# Patient Record
Sex: Male | Born: 1960 | Race: White | Hispanic: No | Marital: Married | Smoking: Former smoker
Health system: Southern US, Community
[De-identification: ages and names within clinical notes are randomized; demographics above are authoritative.]

## PROBLEM LIST (undated history)

## (undated) DIAGNOSIS — Z951 Presence of aortocoronary bypass graft: Secondary | ICD-10-CM

## (undated) DIAGNOSIS — Z8551 Personal history of malignant neoplasm of bladder: Secondary | ICD-10-CM

## (undated) DIAGNOSIS — Z87898 Personal history of other specified conditions: Secondary | ICD-10-CM

## (undated) DIAGNOSIS — N318 Other neuromuscular dysfunction of bladder: Secondary | ICD-10-CM

## (undated) DIAGNOSIS — Z973 Presence of spectacles and contact lenses: Secondary | ICD-10-CM

## (undated) DIAGNOSIS — R319 Hematuria, unspecified: Secondary | ICD-10-CM

## (undated) DIAGNOSIS — C679 Malignant neoplasm of bladder, unspecified: Secondary | ICD-10-CM

## (undated) DIAGNOSIS — Z87820 Personal history of traumatic brain injury: Secondary | ICD-10-CM

## (undated) DIAGNOSIS — M545 Low back pain, unspecified: Secondary | ICD-10-CM

## (undated) DIAGNOSIS — N281 Cyst of kidney, acquired: Secondary | ICD-10-CM

## (undated) DIAGNOSIS — I252 Old myocardial infarction: Secondary | ICD-10-CM

## (undated) DIAGNOSIS — E785 Hyperlipidemia, unspecified: Secondary | ICD-10-CM

## (undated) DIAGNOSIS — M5126 Other intervertebral disc displacement, lumbar region: Secondary | ICD-10-CM

## (undated) DIAGNOSIS — Z8546 Personal history of malignant neoplasm of prostate: Secondary | ICD-10-CM

## (undated) DIAGNOSIS — I2510 Atherosclerotic heart disease of native coronary artery without angina pectoris: Secondary | ICD-10-CM

## (undated) DIAGNOSIS — G8929 Other chronic pain: Secondary | ICD-10-CM

## (undated) DIAGNOSIS — K219 Gastro-esophageal reflux disease without esophagitis: Secondary | ICD-10-CM

## (undated) HISTORY — PX: INGUINAL HERNIA REPAIR: SUR1180

## (undated) HISTORY — PX: CORONARY ARTERY BYPASS GRAFT: SHX141

## (undated) HISTORY — PX: TRANSTHORACIC ECHOCARDIOGRAM: SHX275

---
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
## 2011-04-23 ENCOUNTER — Other Ambulatory Visit: Payer: Self-pay | Admitting: Urology

## 2011-05-01 ENCOUNTER — Encounter (HOSPITAL_BASED_OUTPATIENT_CLINIC_OR_DEPARTMENT_OTHER): Payer: Self-pay | Admitting: *Deleted

## 2011-05-01 NOTE — Progress Notes (Signed)
NPO AFTER MN. PT AT ARRIVE AT 0745. NEEDS HG.

## 2011-05-02 ENCOUNTER — Other Ambulatory Visit: Payer: Self-pay | Admitting: Urology

## 2011-05-02 ENCOUNTER — Encounter (HOSPITAL_BASED_OUTPATIENT_CLINIC_OR_DEPARTMENT_OTHER): Payer: Self-pay | Admitting: Anesthesiology

## 2011-05-02 ENCOUNTER — Ambulatory Visit (HOSPITAL_BASED_OUTPATIENT_CLINIC_OR_DEPARTMENT_OTHER)
Admission: RE | Admit: 2011-05-02 | Discharge: 2011-05-02 | Disposition: A | Discharge status: Home / Self Care | Payer: 59 | Source: Ambulatory Visit | Attending: Urology | Admitting: Urology

## 2011-05-02 ENCOUNTER — Encounter (HOSPITAL_BASED_OUTPATIENT_CLINIC_OR_DEPARTMENT_OTHER)
Admission: RE | Disposition: A | Discharge status: Home / Self Care | Payer: Self-pay | Source: Ambulatory Visit | Attending: Urology

## 2011-05-02 ENCOUNTER — Ambulatory Visit (HOSPITAL_COMMUNITY): Payer: 59

## 2011-05-02 ENCOUNTER — Ambulatory Visit (HOSPITAL_BASED_OUTPATIENT_CLINIC_OR_DEPARTMENT_OTHER): Payer: 59 | Admitting: Anesthesiology

## 2011-05-02 DIAGNOSIS — R310 Gross hematuria: Secondary | ICD-10-CM | POA: Insufficient documentation

## 2011-05-02 DIAGNOSIS — C61 Malignant neoplasm of prostate: Secondary | ICD-10-CM | POA: Insufficient documentation

## 2011-05-02 DIAGNOSIS — D494 Neoplasm of unspecified behavior of bladder: Secondary | ICD-10-CM | POA: Insufficient documentation

## 2011-05-02 DIAGNOSIS — K219 Gastro-esophageal reflux disease without esophagitis: Secondary | ICD-10-CM | POA: Insufficient documentation

## 2011-05-02 HISTORY — DX: Other intervertebral disc displacement, lumbar region: M51.26

## 2011-05-02 HISTORY — PX: OTHER SURGICAL HISTORY: SHX169

## 2011-05-02 HISTORY — PX: TRANSURETHRAL RESECTION OF BLADDER TUMOR: SHX2575

## 2011-05-02 LAB — POCT HEMOGLOBIN-HEMACUE: Hemoglobin: 16.9 g/dL (ref 13.0–17.0)

## 2011-05-02 SURGERY — CYSTOSCOPY
Anesthesia: General | Site: Ureter

## 2011-05-02 MED ORDER — DEXTROSE-NACL 5-0.45 % IV SOLN: INTRAVENOUS | Status: DC

## 2011-05-02 MED ORDER — LACTATED RINGERS IV SOLN
INTRAVENOUS | Status: DC | PRN
  Administered 2011-05-02 (×2): via INTRAVENOUS

## 2011-05-02 MED ORDER — FENTANYL CITRATE 0.05 MG/ML IJ SOLN: 25.0000 ug | INTRAMUSCULAR | Status: DC | PRN

## 2011-05-02 MED ORDER — DEXAMETHASONE SODIUM PHOSPHATE 4 MG/ML IJ SOLN
INTRAMUSCULAR | Status: DC | PRN
  Administered 2011-05-02: 4 mg via INTRAVENOUS

## 2011-05-02 MED ORDER — KETOROLAC TROMETHAMINE 30 MG/ML IJ SOLN
INTRAMUSCULAR | Status: DC | PRN
  Administered 2011-05-02: 30 mg via INTRAVENOUS

## 2011-05-02 MED ORDER — ONDANSETRON HCL 4 MG/2ML IJ SOLN
INTRAMUSCULAR | Status: DC | PRN
  Administered 2011-05-02: 4 mg via INTRAVENOUS

## 2011-05-02 MED ORDER — CEFAZOLIN SODIUM 1-5 GM-% IV SOLN
INTRAVENOUS | Status: DC | PRN
  Administered 2011-05-02: 2 g via INTRAVENOUS

## 2011-05-02 MED ORDER — INDIGOTINDISULFONATE SODIUM 8 MG/ML IJ SOLN
INTRAMUSCULAR | Status: DC | PRN
  Administered 2011-05-02: 5 mL via INTRAVENOUS

## 2011-05-02 MED ORDER — PROPOFOL 10 MG/ML IV EMUL
INTRAVENOUS | Status: DC | PRN
  Administered 2011-05-02: 250 mg via INTRAVENOUS

## 2011-05-02 MED ORDER — MITOMYCIN CHEMO FOR BLADDER INSTILLATION 40 MG: 40.0000 mg | Freq: Once | INTRAVENOUS | Status: DC

## 2011-05-02 MED ORDER — LIDOCAINE HCL (CARDIAC) 20 MG/ML IV SOLN
INTRAVENOUS | Status: DC | PRN
  Administered 2011-05-02: 100 mg via INTRAVENOUS

## 2011-05-02 MED ORDER — SODIUM CHLORIDE 0.9 % IR SOLN
Status: DC | PRN
  Administered 2011-05-02: 3000 mL

## 2011-05-02 MED ORDER — LACTATED RINGERS IV SOLN
INTRAVENOUS | Status: DC
  Administered 2011-05-02: 08:00:00 via INTRAVENOUS

## 2011-05-02 MED ORDER — MIDAZOLAM HCL 5 MG/5ML IJ SOLN
INTRAMUSCULAR | Status: DC | PRN
  Administered 2011-05-02: 2 mg via INTRAVENOUS

## 2011-05-02 MED ORDER — FENTANYL CITRATE 0.05 MG/ML IJ SOLN
INTRAMUSCULAR | Status: DC | PRN
  Administered 2011-05-02: 100 ug via INTRAVENOUS
  Administered 2011-05-02 (×2): 50 ug via INTRAVENOUS
  Administered 2011-05-02: 25 ug via INTRAVENOUS
  Administered 2011-05-02 (×2): 12.5 ug via INTRAVENOUS
  Administered 2011-05-02: 50 ug via INTRAVENOUS

## 2011-05-02 MED ORDER — PROMETHAZINE HCL 25 MG/ML IJ SOLN: 6.2500 mg | INTRAMUSCULAR | Status: DC | PRN

## 2011-05-02 MED ORDER — CEFAZOLIN SODIUM 1-5 GM-% IV SOLN: 1.0000 g | INTRAVENOUS | Status: DC

## 2011-05-02 SURGICAL SUPPLY — 38 items
BAG DRAIN URO-CYSTO SKYTR STRL (DRAIN) ×3 IMPLANT
BAG URINE DRAINAGE (UROLOGICAL SUPPLIES) IMPLANT
BAG URINE LEG 19OZ MD ST LTX (BAG) IMPLANT
BAG URINE LEG 500ML (DRAIN) ×3 IMPLANT
CANISTER SUCT LVC 12 LTR MEDI- (MISCELLANEOUS) ×6 IMPLANT
CATH FOLEY 2WAY SLVR  5CC 16FR (CATHETERS) ×1
CATH FOLEY 2WAY SLVR  5CC 20FR (CATHETERS)
CATH FOLEY 2WAY SLVR  5CC 22FR (CATHETERS)
CATH FOLEY 2WAY SLVR 5CC 16FR (CATHETERS) ×2 IMPLANT
CATH FOLEY 2WAY SLVR 5CC 20FR (CATHETERS) IMPLANT
CATH FOLEY 2WAY SLVR 5CC 22FR (CATHETERS) IMPLANT
CLOTH BEACON ORANGE TIMEOUT ST (SAFETY) ×3 IMPLANT
DRAPE CAMERA CLOSED 9X96 (DRAPES) ×3 IMPLANT
ELECT LOOP HF 26F 30D .35MM (CUTTING LOOP) IMPLANT
ELECT REM PT RETURN 9FT ADLT (ELECTROSURGICAL) ×3
ELECTRODE REM PT RTRN 9FT ADLT (ELECTROSURGICAL) ×2 IMPLANT
EVACUATOR MICROVAS BLADDER (UROLOGICAL SUPPLIES) IMPLANT
GLOVE BIO SURGEON STRL SZ7 (GLOVE) ×3 IMPLANT
GLOVE BIOGEL PI IND STRL 6.5 (GLOVE) ×2 IMPLANT
GLOVE BIOGEL PI INDICATOR 6.5 (GLOVE) ×1
GLOVE ECLIPSE 6.0 STRL STRAW (GLOVE) ×6 IMPLANT
GOWN BRE IMP SLV AUR LG STRL (GOWN DISPOSABLE) ×6 IMPLANT
GUIDEWIRE ANG ZIPWIRE 038X150 (WIRE) ×3 IMPLANT
GUIDEWIRE STR DUAL SENSOR (WIRE) ×3 IMPLANT
HOLDER FOLEY CATH W/STRAP (MISCELLANEOUS) IMPLANT
KIT ASPIRATION TUBING (SET/KITS/TRAYS/PACK) ×3 IMPLANT
LOOP CUTTING 24FR OLYMPUS (CUTTING LOOP) ×3 IMPLANT
NDL SAFETY ECLIPSE 18X1.5 (NEEDLE) IMPLANT
NEEDLE HYPO 18GX1.5 SHARP (NEEDLE)
NEEDLE HYPO 22GX1.5 SAFETY (NEEDLE) IMPLANT
NS IRRIG 500ML POUR BTL (IV SOLUTION) ×3 IMPLANT
PACK CYSTOSCOPY (CUSTOM PROCEDURE TRAY) ×3 IMPLANT
PLUG CATH AND CAP STER (CATHETERS) IMPLANT
PORT OPEN END 8FR 1 LUMEN (CATHETERS) ×3 IMPLANT
STENT CONTOUR 6FRX26X.035 (STENTS) ×3 IMPLANT
SYR 20CC LL (SYRINGE) IMPLANT
SYRINGE IRR TOOMEY STRL 70CC (SYRINGE) ×3 IMPLANT
WATER STERILE IRR 3000ML UROMA (IV SOLUTION) ×18 IMPLANT

## 2011-05-02 NOTE — H&P (Signed)
  Richard Brewer is an 50 y.o. male.    HPI: Mr Febus has a history of gross hematuria on and off since January.  CT scan showed normal upper tracts and a lesion in the posterior wall of the bladder.  Cystoscopy showed a 4 cm papillary tumor on the left posterior wall of the bladder.  He is scheduled for cysto TURBT.  Past Medical History  Diagnosis Date  . Bladder tumor     w/ hematuria  . Lumbar herniated disc     x2 disc-- occ. pain-- goes to chiapractor  . Reflux occasional    watches diet  . Concussion with loss of consciousness 1970's    no residual    Past Surgical History  Procedure Date  . Inguinal hernia repair INFANT    BILATERAL  . Pilonidal cyst excision 1978    Medications Prior to Admission  Medication Dose Route Frequency Provider Last Rate Last Dose  . ceFAZolin (ANCEF) IVPB 1 g/50 mL premix  1 g Intravenous 30 min Pre-Op       . lactated ringers infusion   Intravenous Continuous Einar Pheasant, MD 100 mL/hr at 05/02/11 0800     No current outpatient prescriptions on file as of 05/02/2011.    Allergies: No Known Allergies  History reviewed. No pertinent family history.  Social History:  reports that he quit smoking about 2 years ago. His smoking use included Cigarettes. He has a 18 pack-year smoking history. He has never used smokeless tobacco. He reports that he drinks about 6 ounces of alcohol per week. He reports that he does not use illicit drugs.  Review of Systems: Pertinent items are noted in HPI. A comprehensive review of systems was negative except as noted in the HPI. Results for orders placed during the hospital encounter of 05/02/11 (from the past 48 hour(s))  POCT HEMOGLOBIN-HEMACUE     Status: Normal   Collection Time   05/02/11  8:40 AM      Component Value Range Comment   Hemoglobin 16.9  13.0 - 17.0 (g/dL)     No results found.  Temp:  [97.4 F (36.3 C)] 97.4 F (36.3 C) (11/16 0824) Pulse Rate:  [65] 65  (11/16  0824) Resp:  [20] 20  (11/16 0824) BP: (130)/(89) 130/89 mmHg (11/16 0824) SpO2:  [96 %] 96 % (11/16 0824) Weight:  [95.255 kg (210 lb)] 210 lb (95.255 kg) (11/15 1013)  Physical Exam: General appearance: alert and appears stated age Head: Normocephalic, without obvious abnormality, atraumatic Eyes: conjunctivae/corneas clear. EOM's intact.  Oropharynx: moist mucous membranes Neck: supple, symmetrical, trachea midline Resp: normal respiratory effort Cardio: regular rate and rhythm Back: symmetric, no curvature. ROM normal. No CVA tenderness. GI: soft, non-tender; bowel sounds normal; no masses,  no organomegaly Male genitalia: penis: normal male phallus with no lesions or discharge.Testes: bilaterally descended with no masses or tenderness. no hernias Pelvic: deferred Extremities: extremities normal, atraumatic, no cyanosis or edema Skin: Skin color normal. No visible rashes or lesions Neurologic: Grossly normal  Assessment/Plan Bladder tumor  Plan: Cysto TURBT.  Vergene Marland-HENRY 05/02/2011, 9:24 AM

## 2011-05-02 NOTE — Op Note (Deleted)
Preoperative diagnosis: Adenocarcinoma of prostate, clinical stage TI C., Gleason 4+3  Postoperative diagnosis: Same  Principal procedure: I-125 brachytherapy, cystoscopy  Surgeon: Araf Clugston  Radiation oncologist: Kathrynn Running  Anesthesia: Gen. With LMA  Complications: None  Brief history:  This man is here today for brachytherapy to complete treatment for prostate cancer.  He first presented to our [place] office in February of this year, for second opinion about elevating trend to the PSA.Overview 2 years, his PSA increased from 4.8 in June 2010 to 11.6 in May of 2012. He had been followed by Dr. Rito Ehrlich in [place]. Because of the increasing trend, I recommended ultrasound and biopsy of the prostate. That was performed on 11/26/2010. Glandular size was measured at 48 cc. Biopsies were positive as follows:  Left base lateral, Gleason 3+4, 5% of core Left mid medial, Gleason 3+4, 40% of core Left apex lateral, Gleason 4+3, 10% of core Left apex medial, Gleason 4+3, 50% of core Right mid medial, Gleason 3+3, 10% of core Right apex medial, Gleason 3+4, 50% of core.  Bone scan performed 12/06/2010 was negative. CT of the abdomen and pelvis was performed and this showed increasing size of the prostate compared to a scan in 2 years ago. There is some asymmetry at the base of the prostate. The radiologist read this out as worrisome for progression of tumor. However, I think that this is just some lobulation of the base of the prostate.  He was seen by Dr. Kathrynn Running for consideration of radiotherapy for prostate cancer. It was elected to give him IMRT with possible seed boost. He Has completed IMRT.  Description of procedure: The patient was identified in the holding area and received preoperative IV antibiotics. He was taken to the operating room where general anesthetic was administered using the LMA. He was placed in the dorsolithotomy position genitalia and perineum were prepped and draped.  Timeout was then performed.  A rectal tube and Foley catheter were then placed. Transrectal ultrasound probe was placed by Dr. Kathrynn Running. The perineal template as well as fixation needles were then placed. Scanning of the prostate and treatment planning was performed by Dr. Kathrynn Running.  At this point, I entered the room. Time out was again performed. According to the pre-planned treatment template, a total of 95 iodine seeds were placed using 24 needles. For specifics of the treatment, please see the printed sonographic and planning printout. Following placement of all needles/seeds, the fixation needles and template as well as transrectal ultrasound probe and rectal tube were removed. Spot fluoroscopic images were then taken, and flexible cystoscopy was performed under sterile conditions. Urethral revealed no seeds or lesions. Prostate was obstructive. Latter was entered and inspected circumferentially. There were mild trabeculations. No tumors or foreign bodies were noted. No seeds were identified. At this point approximately 200 cc of saline were left in the bladder, the scope was removed, and the procedure terminated. The patient was awakened and taken to the PACU in stable condition. He tolerated the procedure well.

## 2011-05-02 NOTE — Op Note (Signed)
Richard Brewer is a 50 y.o.   05/02/2011  General  Surgeon: Wendie Simmer. Richard Brewer  PROCEDURE: Cystoscopy, TURBT, Insertion of left JJ stent  Indication: The patient is a 50 years old male with history of gross hematuria on and off since January.  He was seen in the office a month ago.  CT scan showed normal kidneys and a mass on the left lateral wall of the bladder.  Cystoscopy showed a papillary tumor on the left lateral wall of the bladder that measures about 5 cm.  He is scheduled today for cystoscopy, TURBT.  The patient was identified by his wrist band and proper time out was taken.  Under general anesthesia the patient was prepped and draped and placed in the dorsolithotomy position.  A panendoscope was inserted in the bladder.  The anterior urethra is normal; there is moderate prostate hypertrophy.  There is a 5 cm papillary tumor on the left lateral wall of the bladder.  The tumor involves the left ureteral orifice.   The cystoscope was removed.  The urethra was sequentially dilated up to a # 28 Fr [employer] sound.  A resectoscope was then inserted in the bladder.  Resection of the bladder tumor was done.  The left ureteral orifice was also resected.  Hemostasis was secured with electrocautery.  The specimen was then irrigated out of the bladder.  Resection of the base of the bladder tumor was done for staging purposes.  The resectoscope was then removed.  The cystoscope was reinserted in the bladder.  A sensor wire was passed through the cystoscope but could not be passed through the ureteral orice.  One ampule of indigo carmine was then given intravenously.  A glide wire was passed through an open ended catheter and passed through the ureteral orifice.  The open ended catheter was passed over the glide wire in the mid ureter/  The glide wire was then replaced with the sensor wire.  A # [id number] JJ catheter was passed over the sensor wire.  The sensor wire was removed.  Fluoroscopy showed the proximal end  of the JJ stent in the renal pelvis and the distal end in the bladder.  The patient tolerated the procedure well and left the OR in satisfactory condition to PACU.

## 2011-05-02 NOTE — Anesthesia Postprocedure Evaluation (Signed)
  Immediate Anesthesia Transfer of Care Note  Patient: Richard Brewer  Procedure(s) Performed:  CYSTOSCOPY - mytomicin c ; TRANSURETHRAL RESECTION OF BLADDER TUMOR (TURBT); CYSTOSCOPY/RETROGRADE/URETEROSCOPY  Patient Location: PACU  Anesthesia Type: General  Level of Consciousness: awake, sedated, patient cooperative and responds to stimulation  Airway & Oxygen Therapy: Patient Spontanous Breathing and Patient connected to face mask oxygen  Post-op Assessment: Report given to PACU RN, Post -op Vital signs reviewed and stable and Patient moving all extremities  Post vital signs: Reviewed and stable  Complications: No apparent anesthesia complications

## 2011-05-02 NOTE — Anesthesia Procedure Notes (Addendum)
Procedure Name: LMA Insertion Date/Time: 05/02/2011 9:23 AM Performed by: Iline Oven Pre-anesthesia Checklist: Patient identified, Emergency Drugs available, Suction available and Patient being monitored Patient Re-evaluated:Patient Re-evaluated prior to inductionOxygen Delivery Method: Circle System Utilized Preoxygenation: Pre-oxygenation with 100% oxygen Intubation Type: IV induction Ventilation: Mask ventilation without difficulty LMA: LMA inserted LMA Size: 4.0 Number of attempts: 1 Airway Equipment and Method: bite block Placement Confirmation: positive ETCO2 Tube secured with: Tape Dental Injury: Teeth and Oropharynx as per pre-operative assessment

## 2011-05-02 NOTE — Anesthesia Preprocedure Evaluation (Signed)
Anesthesia Evaluation  Patient identified by MRN, date of birth, ID band Patient awake    Reviewed: Allergy & Precautions, H&P , NPO status , Patient's Chart, lab work & pertinent test results, reviewed documented beta blocker date and time   Airway Mallampati: II TM Distance: >3 FB Neck ROM: Full    Dental  (+) Dental Advisory Given   Pulmonary neg pulmonary ROS,  clear to auscultation        Cardiovascular neg cardio ROS Regular Normal Denies cardiac symptoms   Neuro/Psych Negative Neurological ROS  Negative Psych ROS   GI/Hepatic negative GI ROS, Neg liver ROS,   Endo/Other  Negative Endocrine ROS  Renal/GU negative Renal ROS   Bladder Lesion    Musculoskeletal negative musculoskeletal ROS (+)   Abdominal   Peds negative pediatric ROS (+)  Hematology negative hematology ROS (+)   Anesthesia Other Findings Upper front bridge  Reproductive/Obstetrics negative OB ROS                           Anesthesia Physical Anesthesia Plan  ASA: I  Anesthesia Plan: General   Post-op Pain Management:    Induction: Intravenous  Airway Management Planned: LMA  Additional Equipment:   Intra-op Plan:   Post-operative Plan: Extubation in OR  Informed Consent: I have reviewed the patients History and Physical, chart, labs and discussed the procedure including the risks, benefits and alternatives for the proposed anesthesia with the patient or authorized representative who has indicated his/her understanding and acceptance.     Plan Discussed with: CRNA and Surgeon  Anesthesia Plan Comments:         Anesthesia Quick Evaluation

## 2011-05-02 NOTE — Progress Notes (Signed)
Instructed wife and patient on foley catheter care at home, verbalized and demonstrated care.

## 2011-05-02 NOTE — Transfer of Care (Signed)
Immediate Anesthesia Transfer of Care Note  Patient: Richard Brewer  Procedure(s) Performed:  CYSTOSCOPY - mytomicin c ; TRANSURETHRAL RESECTION OF BLADDER TUMOR (TURBT); CYSTOSCOPY/RETROGRADE/URETEROSCOPY  Patient Location: PACU  Anesthesia Type: General  Level of Consciousness: awake, sedated, patient cooperative and responds to stimulation  Airway & Oxygen Therapy: Patient Spontanous Breathing and Patient connected to face mask oxygen  Post-op Assessment: Report given to PACU RN, Post -op Vital signs reviewed and stable and Patient moving all extremities  Post vital signs: Reviewed and stable  Complications: No apparent anesthesia complications

## 2011-05-15 ENCOUNTER — Encounter (HOSPITAL_BASED_OUTPATIENT_CLINIC_OR_DEPARTMENT_OTHER): Payer: Self-pay | Admitting: Urology

## 2011-05-24 ENCOUNTER — Emergency Department (HOSPITAL_COMMUNITY): Payer: 59

## 2011-05-24 ENCOUNTER — Encounter (HOSPITAL_COMMUNITY): Payer: Self-pay | Admitting: *Deleted

## 2011-05-24 ENCOUNTER — Inpatient Hospital Stay (HOSPITAL_COMMUNITY)
Admission: EM | Admit: 2011-05-24 | Discharge: 2011-05-27 | DRG: 690 | Disposition: A | Discharge status: Home / Self Care | Payer: 59 | Attending: Urology | Admitting: Urology

## 2011-05-24 DIAGNOSIS — Z87891 Personal history of nicotine dependence: Secondary | ICD-10-CM

## 2011-05-24 DIAGNOSIS — N12 Tubulo-interstitial nephritis, not specified as acute or chronic: Principal | ICD-10-CM | POA: Diagnosis present

## 2011-05-24 DIAGNOSIS — Z906 Acquired absence of other parts of urinary tract: Secondary | ICD-10-CM

## 2011-05-24 DIAGNOSIS — R509 Fever, unspecified: Secondary | ICD-10-CM | POA: Diagnosis present

## 2011-05-24 DIAGNOSIS — C679 Malignant neoplasm of bladder, unspecified: Secondary | ICD-10-CM | POA: Diagnosis present

## 2011-05-24 DIAGNOSIS — R066 Hiccough: Secondary | ICD-10-CM | POA: Diagnosis present

## 2011-05-24 LAB — COMPREHENSIVE METABOLIC PANEL
AST: 17 U/L (ref 0–37)
BUN: 18 mg/dL (ref 6–23)
CO2: 24 mEq/L (ref 19–32)
Chloride: 96 mEq/L (ref 96–112)
Creatinine, Ser: 1.3 mg/dL (ref 0.50–1.35)
GFR calc non Af Amer: 62 mL/min — ABNORMAL LOW (ref >90)
Total Bilirubin: 0.7 mg/dL (ref 0.3–1.2)

## 2011-05-24 LAB — DIFFERENTIAL
Lymphocytes Relative: 5 % — ABNORMAL LOW (ref 12–46)
Monocytes Absolute: 0.7 10*3/uL (ref 0.1–1.0)
Monocytes Relative: 6 % (ref 3–12)
Neutro Abs: 9.8 10*3/uL — ABNORMAL HIGH (ref 1.7–7.7)

## 2011-05-24 LAB — CBC
HCT: 40.9 % (ref 39.0–52.0)
Hemoglobin: 13.9 g/dL (ref 13.0–17.0)
RBC: 4.49 MIL/uL (ref 4.22–5.81)
WBC: 11 10*3/uL — ABNORMAL HIGH (ref 4.0–10.5)

## 2011-05-24 LAB — URINE MICROSCOPIC-ADD ON

## 2011-05-24 LAB — URINALYSIS, ROUTINE W REFLEX MICROSCOPIC
Glucose, UA: NEGATIVE mg/dL
pH: 7 (ref 5.0–8.0)

## 2011-05-24 MED ORDER — ACETAMINOPHEN 325 MG PO TABS
650.0000 mg | ORAL_TABLET | ORAL | Status: DC | PRN
  Administered 2011-05-24 – 2011-05-25 (×4): 650 mg via ORAL
  Filled 2011-05-24 (×4): qty 2

## 2011-05-24 MED ORDER — HYDROMORPHONE HCL PF 1 MG/ML IJ SOLN
1.0000 mg | Freq: Once | INTRAMUSCULAR | Status: AC
  Administered 2011-05-24: 1 mg via INTRAVENOUS
  Filled 2011-05-24: qty 1

## 2011-05-24 MED ORDER — ACETAMINOPHEN 500 MG PO TABS
1000.0000 mg | ORAL_TABLET | Freq: Once | ORAL | Status: AC
  Administered 2011-05-24: 1000 mg via ORAL
  Filled 2011-05-24: qty 2

## 2011-05-24 MED ORDER — ONDANSETRON HCL 4 MG/2ML IJ SOLN
INTRAMUSCULAR | Status: AC
  Administered 2011-05-24: 4 mg
  Filled 2011-05-24: qty 2

## 2011-05-24 MED ORDER — ONDANSETRON HCL 4 MG/2ML IJ SOLN
4.0000 mg | INTRAMUSCULAR | Status: DC | PRN
  Administered 2011-05-26 – 2011-05-27 (×2): 4 mg via INTRAVENOUS
  Filled 2011-05-24 (×2): qty 2

## 2011-05-24 MED ORDER — IOHEXOL 300 MG/ML  SOLN
100.0000 mL | Freq: Once | INTRAMUSCULAR | Status: AC | PRN
  Administered 2011-05-24: 100 mL via INTRAVENOUS

## 2011-05-24 MED ORDER — PIPERACILLIN-TAZOBACTAM 3.375 G IVPB
3.3750 g | Freq: Three times a day (TID) | INTRAVENOUS | Status: DC
  Administered 2011-05-24 – 2011-05-27 (×8): 3.375 g via INTRAVENOUS
  Filled 2011-05-24 (×12): qty 50

## 2011-05-24 MED ORDER — SENNOSIDES-DOCUSATE SODIUM 8.6-50 MG PO TABS
2.0000 | ORAL_TABLET | Freq: Every day | ORAL | Status: DC
  Filled 2011-05-24 (×4): qty 2

## 2011-05-24 MED ORDER — PIPERACILLIN-TAZOBACTAM 3.375 G IVPB: 3.3750 g | Freq: Once | INTRAVENOUS | Status: DC

## 2011-05-24 MED ORDER — PIPERACILLIN-TAZOBACTAM 3.375 G IVPB 30 MIN
3.3750 g | Freq: Once | INTRAVENOUS | Status: AC
  Administered 2011-05-24: 3.375 g via INTRAVENOUS
  Filled 2011-05-24 (×2): qty 50

## 2011-05-24 MED ORDER — PROMETHAZINE HCL 25 MG/ML IJ SOLN
25.0000 mg | INTRAMUSCULAR | Status: DC | PRN
  Administered 2011-05-24 – 2011-05-27 (×4): 25 mg via INTRAVENOUS
  Filled 2011-05-24 (×4): qty 1

## 2011-05-24 MED ORDER — HYDROCODONE-ACETAMINOPHEN 5-325 MG PO TABS: 1.0000 | ORAL_TABLET | ORAL | Status: DC | PRN

## 2011-05-24 MED ORDER — KCL IN DEXTROSE-NACL 10-5-0.45 MEQ/L-%-% IV SOLN
INTRAVENOUS | Status: DC
  Administered 2011-05-24 – 2011-05-26 (×4): via INTRAVENOUS
  Filled 2011-05-24 (×8): qty 1000

## 2011-05-24 MED ORDER — HYDROMORPHONE HCL PF 1 MG/ML IJ SOLN
0.5000 mg | INTRAMUSCULAR | Status: DC | PRN
  Administered 2011-05-25 – 2011-05-27 (×16): 1 mg via INTRAVENOUS
  Filled 2011-05-24 (×17): qty 1

## 2011-05-24 NOTE — ED Notes (Signed)
History of urinary problems, back pain, generally feels bad

## 2011-05-24 NOTE — ED Notes (Signed)
Attempted to call report unable to give report per the floor they will call back

## 2011-05-24 NOTE — ED Notes (Signed)
Pt resting in bed at this time. Pt is alert and oriented. Pt's wife remains at bedside. Report to [employer]

## 2011-05-24 NOTE — ED Notes (Signed)
Pt returned from ct scan

## 2011-05-24 NOTE — ED Notes (Signed)
Pt presenting to ed with c/o post-op stent pain. Pt states he had a stent placed around thanksgiving and is having fever, pain and body aches. Pt states he does not feel well at all but he's not sure how he's suppose to feel.

## 2011-05-24 NOTE — H&P (Signed)
Richard Brewer is an 50 y.o. male.    Chief Complaint: Fevers  HPI:  50yo man with h/o T1G3 large volume bladder cancer s/p transurethral resection 05/06/11 presents with on/off malaise for 2 weeks and one day of fever, chills, dysuria.  Pt was treated for UTI 1 week ago with short course of Bactrim and had UCX 4 days ago that was negative per report.  He now presents with recurrent fevers, progressive maliase and inability to maintain hydration / nutrition.  UA today with signifiant pyuria, + nitrite and CT in ER with bilat stranding c/w pyelo.  Denies flank pain or gross hematuria. Admits to mild stent colic.  Past Medical History  Diagnosis Date  . Bladder tumor     w/ hematuria  . Lumbar herniated disc     x2 disc-- occ. pain-- goes to chiapractor  . Reflux occasional    watches diet  . Concussion with loss of consciousness 1970's    no residual    Past Surgical History  Procedure Date  . Inguinal hernia repair INFANT    BILATERAL  . Pilonidal cyst excision 1978  . Cystoscopy 05/02/2011    Procedure: CYSTOSCOPY;  Surgeon: Lindaann Slough, MD;  Location: [facility];  Service: Urology;  Laterality: N/A;  mytomicin c   . Transurethral resection of bladder tumor 05/02/2011    Procedure: TRANSURETHRAL RESECTION OF BLADDER TUMOR (TURBT);  Surgeon: Lindaann Slough, MD;  Location: [facility];  Service: Urology;  Laterality: N/A;  . Cystoscopy/retrograde/ureteroscopy 05/02/2011    Procedure: CYSTOSCOPY/RETROGRADE/URETEROSCOPY;  Surgeon: Lindaann Slough, MD;  Location: [facility];  Service: Urology;  Laterality: Left;    No family history on file. Social History:  reports that he quit smoking about 2 years ago. His smoking use included Cigarettes. He has a 18 pack-year smoking history. He has never used smokeless tobacco. He reports that he drinks alcohol. He reports that he does not use illicit drugs.  Allergies: No Known  Allergies  Medications Prior to Admission  Medication Dose Route Frequency Provider Last Rate Last Dose  . acetaminophen (TYLENOL) tablet 1,000 mg  1,000 mg Oral Once Geoffery Lyons, MD   1,000 mg at 05/24/11 1316  . HYDROmorphone (DILAUDID) injection 1 mg  1 mg Intravenous Once Geoffery Lyons, MD   1 mg at 05/24/11 1317  . iohexol (OMNIPAQUE) 300 MG/ML solution 100 mL  100 mL Intravenous Once PRN Medication Radiologist   100 mL at 05/24/11 1416  . ondansetron (ZOFRAN) 4 MG/2ML injection        4 mg at 05/24/11 1324  . piperacillin-tazobactam (ZOSYN) IVPB 3.375 g  3.375 g Intravenous Once Reece Packer, PHARMD      . DISCONTD: piperacillin-tazobactam (ZOSYN) IVPB 3.375 g  3.375 g Intravenous Once Geoffery Lyons, MD       No current outpatient prescriptions on file as of 05/24/2011.    Results for orders placed during the hospital encounter of 05/24/11 (from the past 48 hour(s))  CBC     Status: Abnormal   Collection Time   05/24/11 12:36 PM      Component Value Range Comment   WBC 11.0 (*) 4.0 - 10.5 (K/uL)    RBC 4.49  4.22 - 5.81 (MIL/uL)    Hemoglobin 13.9  13.0 - 17.0 (g/dL)    HCT 16.1  09.6 - 04.5 (%)    MCV 91.1  78.0 - 100.0 (fL)    MCH 31.0  26.0 - 34.0 (pg)  MCHC 34.0  30.0 - 36.0 (g/dL)    RDW 09.8  11.9 - 14.7 (%)    Platelets 215  150 - 400 (K/uL)   DIFFERENTIAL     Status: Abnormal   Collection Time   05/24/11 12:36 PM      Component Value Range Comment   Neutrophils Relative 89 (*) 43 - 77 (%)    Neutro Abs 9.8 (*) 1.7 - 7.7 (K/uL)    Lymphocytes Relative 5 (*) 12 - 46 (%)    Lymphs Abs 0.5 (*) 0.7 - 4.0 (K/uL)    Monocytes Relative 6  3 - 12 (%)    Monocytes Absolute 0.7  0.1 - 1.0 (K/uL)    Eosinophils Relative 0  0 - 5 (%)    Eosinophils Absolute 0.0  0.0 - 0.7 (K/uL)    Basophils Relative 0  0 - 1 (%)    Basophils Absolute 0.0  0.0 - 0.1 (K/uL)   COMPREHENSIVE METABOLIC PANEL     Status: Abnormal   Collection Time   05/24/11 12:36 PM      Component  Value Range Comment   Sodium 133 (*) 135 - 145 (mEq/L)    Potassium 3.6  3.5 - 5.1 (mEq/L)    Chloride 96  96 - 112 (mEq/L)    CO2 24  19 - 32 (mEq/L)    Glucose, Bld 129 (*) 70 - 99 (mg/dL)    BUN 18  6 - 23 (mg/dL)    Creatinine, Ser 8.29  0.50 - 1.35 (mg/dL)    Calcium 9.3  8.4 - 10.5 (mg/dL)    Total Protein 7.4  6.0 - 8.3 (g/dL)    Albumin 3.2 (*) 3.5 - 5.2 (g/dL)    AST 17  0 - 37 (U/L)    ALT 44  0 - 53 (U/L)    Alkaline Phosphatase 77  39 - 117 (U/L)    Total Bilirubin 0.7  0.3 - 1.2 (mg/dL)    GFR calc non Af Amer 62 (*) >90 (mL/min)    GFR calc Af Amer 71 (*) >90 (mL/min)   URINALYSIS, ROUTINE W REFLEX MICROSCOPIC     Status: Abnormal   Collection Time   05/24/11 12:48 PM      Component Value Range Comment   Color, Urine RED (*) YELLOW  BIOCHEMICALS MAY BE AFFECTED BY COLOR   APPearance CLOUDY (*) CLEAR     Specific Gravity, Urine 1.020  1.005 - 1.030     pH 7.0  5.0 - 8.0     Glucose, UA NEGATIVE  NEGATIVE (mg/dL)    Hgb urine dipstick LARGE (*) NEGATIVE     Bilirubin Urine NEGATIVE  NEGATIVE     Ketones, ur NEGATIVE  NEGATIVE (mg/dL)    Protein, ur 562 (*) NEGATIVE (mg/dL)    Urobilinogen, UA 1.0  0.0 - 1.0 (mg/dL)    Nitrite POSITIVE (*) NEGATIVE     Leukocytes, UA LARGE (*) NEGATIVE    URINE MICROSCOPIC-ADD ON     Status: Abnormal   Collection Time   05/24/11 12:48 PM      Component Value Range Comment   WBC, UA TOO NUMEROUS TO COUNT  <3 (WBC/hpf)    RBC / HPF TOO NUMEROUS TO COUNT  <3 (RBC/hpf)    Bacteria, UA MANY (*) RARE     Ct Abdomen Pelvis W Contrast  05/24/2011  *RADIOLOGY REPORT*  Clinical Data: Back pain.  Fever.  CT ABDOMEN AND PELVIS WITH CONTRAST  Technique:  Multidetector CT imaging of the abdomen and pelvis was performed following the standard protocol during bolus administration of intravenous contrast.  Contrast: OMNIPAQUE IOHEXOL 300 MG/ML IV SOLN  Comparison: 04/03/2011  Findings: Left double-J ureteral stent has been placed.  No  hydronephrosis.  Symmetrical secretion of both kidneys is evident. Stent extends from the left renal pelvis to the bladder.  The nephrogram of the left kidney, however is striated  and patchy. No abscess.  Hypodensities in the kidneys are stable.  Liver, gallbladder, spleen, pancreas, adrenal glands are within normal limits.  Bowel is decompressed.  Normal appendix.  The mass at the base of the bladder is no longer present and has probably been resected.  Unremarkable prostate.  L4-5 degenerative disc disease.  No destructive bone lesion.  IMPRESSION: Interval placement of a left double-J ureteral stent and resection of the mass within the left side of the bladder base.  Patchy or striated nephrographic phase of the left kidney is evident.  This pattern can be seen with acute pyelonephritis.  No abscess is present.  There is no evidence of obstruction given the symmetrical contrast excretion pattern.  Original Report Authenticated By: Donavan Burnet, M.D.    Review of Systems  Constitutional: Positive for fever, chills, weight loss, malaise/fatigue and diaphoresis.  HENT: Negative.   Eyes: Negative.   Respiratory: Negative.   Cardiovascular: Negative.   Gastrointestinal: Negative.   Genitourinary: Positive for dysuria, frequency and flank pain.  Musculoskeletal: Positive for back pain.  Skin: Negative.   Neurological: Positive for weakness.  Endo/Heme/Allergies: Negative.   Psychiatric/Behavioral: Negative.     Blood pressure 148/92, pulse 102, temperature 102.2 F (39 C), temperature source Oral, resp. rate 18, SpO2 93.00%. Physical Exam  Constitutional: He is oriented to person, place, and time. He appears well-developed and well-nourished.       Here with wife. Visibly ill.  HENT:  Head: Normocephalic and atraumatic.  Eyes: EOM are normal. Pupils are equal, round, and reactive to light.  Neck: Normal range of motion. Neck supple.  Cardiovascular: Normal rate.   Respiratory: Effort  normal and breath sounds normal.  GI: Soft. Bowel sounds are normal.  Genitourinary: Penis normal.       No CVA Tenderness or SP tenderness  Musculoskeletal: Normal range of motion.  Neurological: He is alert and oriented to person, place, and time. He has normal reflexes.  Skin: Skin is warm. He is diaphoretic.  Psychiatric: He has a normal mood and affect. His behavior is normal. Judgment and thought content normal.     Assessment/Plan 1 - Fevers - exam, history, labs, and imaging consistent with pyelonephritis. No pelvic fluid, renal abscess, prostatic abscess present.  As pt with recurrent symptoms despite outpatient management as well as poor PO intake, will admit for IV antibiotics and hydration.  2 - Bladder Cancer - s/p resection of clinically localized T1G3 disease. Now on surveillance protocol. CT today without progressive disease.   Ashleigh Luckow 05/24/2011, 3:08 PM

## 2011-05-24 NOTE — ED Provider Notes (Signed)
History     CSN: [id number] Arrival date & time: 05/24/2011 11:43 AM   First MD Initiated Contact with Patient 05/24/11 1247      Chief Complaint  Patient presents with  . Back Pain  . Fever  . Urinary Tract Infection    (Consider location/radiation/quality/duration/timing/severity/associated sxs/prior treatment) HPI Comments: Patient with history of bladder cancer diagnosed recently.  Had surgery to remove tumor.  Shortly afterward, he developed a uti and was treated with antibiotics and improved.  Now having back pain and fever again.  Believes this is another uti.  Noted slight blood in the urine.    Patient is a 50 y.o. male presenting with back pain, fever, and urinary tract infection. The history is provided by the patient.  Back Pain  This is a recurrent problem. The current episode started yesterday. The problem occurs constantly. The problem has been gradually worsening. The pain is associated with no known injury. The pain is present in the lumbar spine. Associated symptoms include a fever.  Fever Primary symptoms of the febrile illness include fever.  Urinary Tract Infection    Past Medical History  Diagnosis Date  . Bladder tumor     w/ hematuria  . Lumbar herniated disc     x2 disc-- occ. pain-- goes to chiapractor  . Reflux occasional    watches diet  . Concussion with loss of consciousness 1970's    no residual    Past Surgical History  Procedure Date  . Inguinal hernia repair INFANT    BILATERAL  . Pilonidal cyst excision 1978  . Cystoscopy 05/02/2011    Procedure: CYSTOSCOPY;  Surgeon: Lindaann Slough, MD;  Location: [facility];  Service: Urology;  Laterality: N/A;  mytomicin c   . Transurethral resection of bladder tumor 05/02/2011    Procedure: TRANSURETHRAL RESECTION OF BLADDER TUMOR (TURBT);  Surgeon: Lindaann Slough, MD;  Location: [facility];  Service: Urology;  Laterality: N/A;  .  Cystoscopy/retrograde/ureteroscopy 05/02/2011    Procedure: CYSTOSCOPY/RETROGRADE/URETEROSCOPY;  Surgeon: Lindaann Slough, MD;  Location: [facility];  Service: Urology;  Laterality: Left;    No family history on file.  History  Substance Use Topics  . Smoking status: Former Smoker -- 1.0 packs/day for 18 years    Types: Cigarettes    Quit date: 04/30/2009  . Smokeless tobacco: Never Used  . Alcohol Use: 0.0 oz/week     no ETOH since Oct      Review of Systems  Constitutional: Positive for fever.  Musculoskeletal: Positive for back pain.  All other systems reviewed and are negative.    Allergies  Review of patient's allergies indicates no known allergies.  Home Medications   Current Outpatient Rx  Name Route Sig Dispense Refill  . IBUPROFEN 200 MG PO TABS Oral Take 200 mg by mouth every 6 (six) hours as needed. pain       BP 137/85  Pulse 125  Temp(Src) 102.2 F (39 C) (Oral)  Resp 20  SpO2 97%  Physical Exam  Nursing note and vitals reviewed. Constitutional: He is oriented to person, place, and time. He appears well-developed and well-nourished. No distress.  HENT:  Head: Normocephalic and atraumatic.  Neck: Normal range of motion. Neck supple.  Cardiovascular: Normal rate.  Exam reveals friction rub. Exam reveals no gallop.   No murmur heard. Pulmonary/Chest: Effort normal and breath sounds normal. No respiratory distress.  Abdominal: Soft. Bowel sounds are normal. He exhibits no distension. There is no  tenderness.  Musculoskeletal: Normal range of motion.  Neurological: He is alert and oriented to person, place, and time.  Skin: Skin is warm and dry. He is not diaphoretic.    ED Course  Procedures (including critical care time)   Labs Reviewed  URINALYSIS, ROUTINE W REFLEX MICROSCOPIC  CBC  DIFFERENTIAL  COMPREHENSIVE METABOLIC PANEL  URINE CULTURE   No results found.   No diagnosis found.    MDM  Patient evaluated by Dr.  Loreta Ave.  We are in agreement that the patient requires admission for IV antibiotics and hydration.  Will admit to Urology.        Geoffery Lyons, MD 05/24/11 1500

## 2011-05-25 LAB — CBC
HCT: 35.1 % — ABNORMAL LOW (ref 39.0–52.0)
MCH: 30.1 pg (ref 26.0–34.0)
MCHC: 33.3 g/dL (ref 30.0–36.0)
RDW: 12.1 % (ref 11.5–15.5)

## 2011-05-25 LAB — BASIC METABOLIC PANEL
BUN: 14 mg/dL (ref 6–23)
Chloride: 98 mEq/L (ref 96–112)
Creatinine, Ser: 1.33 mg/dL (ref 0.50–1.35)
GFR calc Af Amer: 70 mL/min — ABNORMAL LOW (ref >90)
Glucose, Bld: 143 mg/dL — ABNORMAL HIGH (ref 70–99)

## 2011-05-25 MED ORDER — BACLOFEN 10 MG PO TABS
10.0000 mg | ORAL_TABLET | Freq: Four times a day (QID) | ORAL | Status: DC
  Administered 2011-05-25 – 2011-05-27 (×8): 10 mg via ORAL
  Filled 2011-05-25 (×10): qty 1

## 2011-05-25 MED ORDER — METOCLOPRAMIDE HCL 5 MG/ML IJ SOLN
10.0000 mg | Freq: Three times a day (TID) | INTRAMUSCULAR | Status: DC
  Administered 2011-05-25 – 2011-05-27 (×8): 10 mg via INTRAVENOUS
  Filled 2011-05-25 (×10): qty 2

## 2011-05-25 NOTE — Progress Notes (Signed)
CC: Hostpital Day 2 for Pyelonephritis  Subjective: Intermit ant fevers overnight. Complains of hiccups interrupting sleep. No flank pain, emesis. No dizziness.  Objective: Vital signs in last 24 hours: Temp:  [99.6 F (37.6 C)-103 F (39.4 C)] 101.6 F (38.7 C) (12/09 0510) Pulse Rate:  [87-125] 95  (12/09 0510) Resp:  [14-21] 16  (12/09 0510) BP: (116-168)/(76-96) 121/79 mmHg (12/09 0510) SpO2:  [93 %-99 %] 96 % (12/09 0510) Weight:  [88.3 kg (194 lb 10.7 oz)] 194 lb 10.7 oz (88.3 kg) (12/08 1710) Last BM Date: 05/20/11  Intake/Output from previous day: Void x3   Intake/Output this shift:    General appearance: alert, cooperative and appears stated age Head: Normocephalic, without obvious abnormality, atraumatic Eyes: conjunctivae/corneas clear. PERRL, EOM's intact. Fundi benign. Resp: clear to auscultation bilaterally Cardio: regular rate and rhythm, S1, S2 normal, no murmur, click, rub or gallop GI: soft, non-tender; bowel sounds normal; no masses,  no organomegaly Male genitalia: normal, penis: no lesions or discharge. testes: no masses or tenderness. no hernias Extremities: extremities normal, atraumatic, no cyanosis or edema and no edema, redness or tenderness in the calves or thighs Pulses: 2+ and symmetric Skin: Skin color, texture, turgor normal. No rashes or lesions Neurologic: Grossly normal  Lab Results:   Basename 05/25/11 0358 05/24/11 1236  WBC 10.1 11.0*  HGB 11.7* 13.9  HCT 35.1* 40.9  PLT 208 215   BMET  Basename 05/25/11 0358 05/24/11 1236  NA 132* 133*  K 3.8 3.6  CL 98 96  CO2 26 24  GLUCOSE 143* 129*  BUN 14 18  CREATININE 1.33 1.30  CALCIUM 8.5 9.3   PT/INR No results found for this basename: LABPROT:2,INR:2 in the last 72 hours ABG No results found for this basename: PHART:2,PCO2:2,PO2:2,HCO3:2 in the last 72 hours  Studies/Results: Ct Abdomen Pelvis W Contrast  05/24/2011  *RADIOLOGY REPORT*  Clinical Data: Back pain.  Fever.   CT ABDOMEN AND PELVIS WITH CONTRAST  Technique:  Multidetector CT imaging of the abdomen and pelvis was performed following the standard protocol during bolus administration of intravenous contrast.  Contrast: OMNIPAQUE IOHEXOL 300 MG/ML IV SOLN  Comparison: 04/03/2011  Findings: Left double-J ureteral stent has been placed.  No hydronephrosis.  Symmetrical secretion of both kidneys is evident. Stent extends from the left renal pelvis to the bladder.  The nephrogram of the left kidney, however is striated  and patchy. No abscess.  Hypodensities in the kidneys are stable.  Liver, gallbladder, spleen, pancreas, adrenal glands are within normal limits.  Bowel is decompressed.  Normal appendix.  The mass at the base of the bladder is no longer present and has probably been resected.  Unremarkable prostate.  L4-5 degenerative disc disease.  No destructive bone lesion.  IMPRESSION: Interval placement of a left double-J ureteral stent and resection of the mass within the left side of the bladder base.  Patchy or striated nephrographic phase of the left kidney is evident.  This pattern can be seen with acute pyelonephritis.  No abscess is present.  There is no evidence of obstruction given the symmetrical contrast excretion pattern.  Original Report Authenticated By: Donavan Burnet, M.D.    Anti-infectives: Anti-infectives     Start     Dose/Rate Route Frequency Ordered Stop   05/24/11 1800   piperacillin-tazobactam (ZOSYN) IVPB 3.375 g        3.375 g 12.5 mL/hr over 240 Minutes Intravenous 3 times per day 05/24/11 1731     05/24/11 1500  piperacillin-tazobactam (ZOSYN) IVPB 3.375 g  Status:  Discontinued        3.375 g 12.5 mL/hr over 240 Minutes Intravenous  Once 05/24/11 1456 05/24/11 1458   05/24/11 1500   piperacillin-tazobactam (ZOSYN) IVPB 3.375 g        3.375 g 100 mL/hr over 30 Minutes Intravenous  Once 05/24/11 1459 05/24/11 1733          Assessment/Plan: 1 - Pyelonephritis - Pt with  intermittent fevers as expected with pyelonephritis. Continue emperic ABX,IV Hydration,  cultures pending.   2 - Hiccups - no underlying reversible cause identified on imaging or exam. Will begin scheduled Reglan.   3 - Remain in house.   LOS: 1 day    [facility], Kennard Fildes 05/25/2011

## 2011-05-26 LAB — URINE CULTURE

## 2011-05-26 MED ORDER — DEXTROSE-NACL 5-0.9 % IV SOLN
INTRAVENOUS | Status: DC
  Administered 2011-05-26: 1000 mL via INTRAVENOUS
  Administered 2011-05-26: 12:00:00 via INTRAVENOUS
  Administered 2011-05-27: 1000 mL via INTRAVENOUS

## 2011-05-26 NOTE — Progress Notes (Signed)
Patient ID: Richard Brewer, male   DOB: Nov 10, 1960, 50 y.o.   MRN: [id number]  Subjective: Patient feels better this morning.  Still complains of hiccups.  No flank pain this morning.  He voids well.  Urine is grossly clear.  Objective: Vital signs in last 24 hours: Temp:  [98.6 F (37 C)-102.3 F (39.1 C)] 99.7 F (37.6 C) (12/10 0510) Pulse Rate:  [72-89] 73  (12/10 0510) Resp:  [18-20] 20  (12/10 0510) BP: (131-155)/(79-88) 131/79 mmHg (12/10 0510) SpO2:  [95 %-98 %] 98 % (12/10 0510)  Intake/Output from previous day: 12/09 0701 - 12/10 0700 In: 925 [P.O.:925] Out: 775 [Urine:775]   Physical Exam:   Lungs - Normal respiratory effort, chest expands symmetrically.  Abdomen - Soft, non-tender & non-distended.  No CVA tenderness  Lab Results:  Basename 05/25/11 0358 05/24/11 1236  HGB 11.7* 13.9  HCT 35.1* 40.9   BMET  Basename 05/25/11 0358 05/24/11 1236  NA 132* 133*  K 3.8 3.6  CL 98 96  CO2 26 24  GLUCOSE 143* 129*  BUN 14 18  CREATININE 1.33 1.30  CALCIUM 8.5 9.3       Studies/Results: Ct Abdomen Pelvis W Contrast  05/24/2011  *RADIOLOGY REPORT*  Clinical Data: Back pain.  Fever.  CT ABDOMEN AND PELVIS WITH CONTRAST  Technique:  Multidetector CT imaging of the abdomen and pelvis was performed following the standard protocol during bolus administration of intravenous contrast.  Contrast: OMNIPAQUE IOHEXOL 300 MG/ML IV SOLN  Comparison: 04/03/2011  Findings: Left double-J ureteral stent has been placed.  No hydronephrosis.  Symmetrical secretion of both kidneys is evident. Stent extends from the left renal pelvis to the bladder.  The nephrogram of the left kidney, however is striated  and patchy. No abscess.  Hypodensities in the kidneys are stable.  Liver, gallbladder, spleen, pancreas, adrenal glands are within normal limits.  Bowel is decompressed.  Normal appendix.  The mass at the base of the bladder is no longer present and has probably been resected.   Unremarkable prostate.  L4-5 degenerative disc disease.  No destructive bone lesion.  IMPRESSION: Interval placement of a left double-J ureteral stent and resection of the mass within the left side of the bladder base.  Patchy or striated nephrographic phase of the left kidney is evident.  This pattern can be seen with acute pyelonephritis.  No abscess is present.  There is no evidence of obstruction given the symmetrical contrast excretion pattern.  Original Report Authenticated By: Donavan Burnet, M.D.    Assessment/Plan:  Acute Pyelonephritis.  Continue Zosyn.   LOS: 2 days   Richard Brewer 05/26/2011, 9:43 AM

## 2011-05-26 NOTE — Progress Notes (Signed)
INITIAL ADULT NUTRITION ASSESSMENT Date: 05/26/2011   Time: 11:16 AM Reason for Assessment: Nutrition risk   ASSESSMENT: Male 50 y.o.  Dx: Fever  Hx:  Past Medical History  Diagnosis Date  . Bladder tumor     w/ hematuria  . Lumbar herniated disc     x2 disc-- occ. pain-- goes to chiapractor  . Reflux occasional    watches diet  . Concussion with loss of consciousness 1970's    no residual  . Bladder cancer    Related Meds:  Scheduled Meds:   . baclofen  10 mg Oral QID  . metoCLOPramide (REGLAN) injection  10 mg Intravenous Q8H  . piperacillin-tazobactam (ZOSYN)  IV  3.375 g Intravenous Q8H  . senna-docusate  2 tablet Oral QHS   Continuous Infusions:   . dextrose 5 % and 0.9% NaCl    . DISCONTD: dextrose 5 % and 0.45 % NaCl with KCl 10 mEq/L 125 mL/hr at 05/26/11 0343   PRN Meds:.acetaminophen, HYDROcodone-acetaminophen, HYDROmorphone (DILAUDID) injection, ondansetron, promethazine  Ht: 5\' 10"  (177.8 cm)  Wt: 194 lb 10.7 oz (88.3 kg)  Ideal Wt: 75.5kg % Ideal Wt: 117  Usual Wt: 95.5kg % Usual Wt: 92  Body mass index is 27.93 kg/(m^2).  Food/Nutrition Related Hx: Pt and wife report poor intake since transurethral resection on 11/20 with 15 pound unintentional weight loss during this time. Pt c/o vomiting over the weekend, currently no vomiting today or yesterday. Pt had hiccups this morning, currently resolved. Pt c/o changes in taste and that even bottled water tastes bad. Pt burping and c/o gas pain during visit.   CT of abdomen/pelvis on 12/8 showed: Interval placement of a left double-J ureteral stent and resection  of the mass within the left side of the bladder base.  Patchy or striated nephrographic phase of the left kidney is  evident. This pattern can be seen with acute pyelonephritis. No  abscess is present. There is no evidence of obstruction given the  symmetrical contrast excretion pattern.  Labs:  CMP     Component Value Date/Time   NA 132*  05/25/2011 0358   K 3.8 05/25/2011 0358   CL 98 05/25/2011 0358   CO2 26 05/25/2011 0358   GLUCOSE 143* 05/25/2011 0358   BUN 14 05/25/2011 0358   CREATININE 1.33 05/25/2011 0358   CALCIUM 8.5 05/25/2011 0358   PROT 7.4 05/24/2011 1236   ALBUMIN 3.2* 05/24/2011 1236   AST 17 05/24/2011 1236   ALT 44 05/24/2011 1236   ALKPHOS 77 05/24/2011 1236   BILITOT 0.7 05/24/2011 1236   GFRNONAA 60* 05/25/2011 0358   GFRAA 70* 05/25/2011 0358    Intake/Output Summary (Last 24 hours) at 05/26/11 1121 Last data filed at 05/26/11 1000  Gross per 24 hour  Intake    800 ml  Output   1525 ml  Net   -725 ml    Diet Order: General  IVF:    dextrose 5 % and 0.9% NaCl   DISCONTD: dextrose 5 % and 0.45 % NaCl with KCl 10 mEq/L Last Rate: 125 mL/hr at 05/26/11 0343    Estimated Nutritional Needs:   Kcal:2150-2500 Protein:90-105g Fluid:2.1-2.5L  NUTRITION DIAGNOSIS: -Inadequate oral intake (NI-2.1).  Status: Ongoing -Pt meets criteria for severe PCM for acute illness AEB 7.6% weight loss and <50% intake for the past 2 weeks per pt report   RELATED TO: taste changes/nausea/vomiting/fever/hiccups  AS EVIDENCE BY: H&P, pt statement, unintentional weight loss, <50% meal intake    MONITORING/EVALUATION(Goals):  Pt to consume >75% of meals.   EDUCATION NEEDS: -Education needs addressed. Discussed nutrition therapy for nausea/vomting.   INTERVENTION: Provided pt with Biotene rinse to help with taste changes. Assisted pt with ordering snack. Encouraged bland food intake. Pt not interested in nutritional supplements. Will monitor.   Dietitian # [phone]  DOCUMENTATION CODES Per approved criteria  -Severe malnutrition in the context of acute illness or injury    Marshall Cork 05/26/2011, 11:16 AM

## 2011-05-27 MED ORDER — PROMETHAZINE HCL 25 MG PO TABS: 25.0000 mg | ORAL_TABLET | ORAL | Status: AC | PRN

## 2011-05-27 MED ORDER — BIOTENE DRY MOUTH MT LIQD
15.0000 mL | Freq: Two times a day (BID) | OROMUCOSAL | Status: DC
  Administered 2011-05-27: 15 mL via OROMUCOSAL

## 2011-05-27 MED ORDER — ACETAMINOPHEN 325 MG PO TABS: 650.0000 mg | ORAL_TABLET | ORAL | Status: AC | PRN

## 2011-05-27 MED ORDER — LEVOFLOXACIN 500 MG PO TABS: 500.0000 mg | ORAL_TABLET | ORAL | Status: AC

## 2011-05-27 MED ORDER — LEVOFLOXACIN 500 MG PO TABS
500.0000 mg | ORAL_TABLET | ORAL | Status: DC
  Administered 2011-05-27: 500 mg via ORAL
  Filled 2011-05-27 (×2): qty 1

## 2011-05-27 MED ORDER — PROMETHAZINE HCL 25 MG PO TABS: 25.0000 mg | ORAL_TABLET | ORAL | Status: DC | PRN

## 2011-05-27 MED ORDER — BACLOFEN 10 MG PO TABS: 10.0000 mg | ORAL_TABLET | Freq: Four times a day (QID) | ORAL | Status: DC

## 2011-05-27 MED ORDER — HYDROCODONE-ACETAMINOPHEN 5-325 MG PO TABS: 1.0000 | ORAL_TABLET | ORAL | Status: AC | PRN

## 2011-05-27 NOTE — Progress Notes (Signed)
D/C instructions renderd, patient verbalized understanding,care notes re: Norco given, stable,d/c home with wife.

## 2011-05-27 NOTE — Plan of Care (Signed)
Problem: Phase I Progression Outcomes Goal: Initial discharge plan identified Outcome: Completed/Met Date Met:  05/27/11 Plan is to return home at discharge

## 2011-05-27 NOTE — Plan of Care (Signed)
Problem: Consults Goal: Nutrition Consult-if indicated Outcome: Completed/Met Date Met:  05/27/11 Dietician saw 05/26/11

## 2011-05-27 NOTE — Progress Notes (Signed)
D/C home, stable,Iv  Line removed,site unremarkable

## 2011-05-27 NOTE — Progress Notes (Signed)
T:  98.5  BP: 130/71   P: 63   R:  18 Still complaining of hiccups.  No nausea or vomiting today.  No flank pain. Had BM's yesterday. Voids well.  Urine clear Abdomen: soft, non distended, non tender.  Plan: Discharge home today.          Follow-up as outpatient.  Will remove JJ stent Friday.          Discharge meds: Levaquin 500 mgm daily. Phenergan 25 mgm every 4-6 H PRN.

## 2011-08-01 ENCOUNTER — Other Ambulatory Visit: Payer: Self-pay | Admitting: Urology

## 2011-08-05 ENCOUNTER — Encounter (HOSPITAL_BASED_OUTPATIENT_CLINIC_OR_DEPARTMENT_OTHER): Payer: Self-pay | Admitting: *Deleted

## 2011-08-05 NOTE — Progress Notes (Signed)
NPO AFTER MN. ARRIVES AT 0945. NEEDS HG AND EKG. 

## 2011-08-07 NOTE — H&P (Signed)
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  He received six weekly instillations of intravesical BCG.  He is scheduled for cystoscopy, TUR Bladder biopsy to rule out residual or recurrent tumor.     Past Medical History  Diagnosis Date  . Bladder tumor     w/ hematuria  . Lumbar herniated disc     x2 disc-- occ. pain-- goes to chiapractor  . Reflux occasional    watches diet  . Concussion with loss of consciousness 1970's    no residual  . Bladder cancer S/P TURBT, MITOMYCIN INSTILLATION AND LAST BCG TX 02-   Past Surgical History  Procedure Date  . Inguinal hernia repair INFANT    BILATERAL  . Pilonidal cyst excision 1978  . Cystoscopy 05/02/2011    Procedure: CYSTOSCOPY;  Surgeon: Lindaann Slough, MD;  Location: [facility];  Service: Urology;  Laterality: N/A;  mytomicin c   . Transurethral resection of bladder tumor 05/02/2011    Procedure: TRANSURETHRAL RESECTION OF BLADDER TUMOR (TURBT);  Surgeon: Lindaann Slough, MD;  Location: [facility];  Service: Urology;  Laterality: N/A;  . Cystoscopy/retrograde/ureteroscopy 05/02/2011    Procedure: CYSTOSCOPY/RETROGRADE/URETEROSCOPY;  Surgeon: Lindaann Slough, MD;  Location: [facility];  Service: Urology;  Laterality: Left;    Medications:Diazepam, Robaxin, Tramadol. Allergies: No Known Allergies  History reviewed. No pertinent family history. Social History:  reports that he quit smoking about 2 years ago. His smoking use included Cigarettes. He has a 18 pack-year smoking history. He has never used smokeless tobacco. He reports that he drinks alcohol. He reports that he does not use illicit drugs.  ROS: All systems are reviewed and negative except as noted.  Physical Exam:  Vital signs in last 24 hours:    Cardiovascular: Skin warm; not flushed Respiratory: Breaths quiet; no shortness of breath Abdomen:  No masses Neurological: Normal sensation to touch Musculoskeletal: Normal motor function arms and legs Lymphatics: No inguinal adenopathy Skin: No rashes Genitourinary:Penis and scrotal contents are within normal limits.  Impression/Assessment:  R/O recurrent bladder tumor.  Plan:  Cysto TUR Bladder biopsy  Amelianna Meller-HENRY 08/07/2011, 6:58 PM

## 2011-08-08 ENCOUNTER — Ambulatory Visit (HOSPITAL_BASED_OUTPATIENT_CLINIC_OR_DEPARTMENT_OTHER)
Admission: RE | Admit: 2011-08-08 | Discharge: 2011-08-08 | Disposition: A | Discharge status: Home / Self Care | Payer: 59 | Source: Ambulatory Visit | Attending: Urology | Admitting: Urology

## 2011-08-08 ENCOUNTER — Ambulatory Visit (HOSPITAL_BASED_OUTPATIENT_CLINIC_OR_DEPARTMENT_OTHER): Payer: 59 | Admitting: Anesthesiology

## 2011-08-08 ENCOUNTER — Other Ambulatory Visit: Payer: Self-pay | Admitting: Urology

## 2011-08-08 ENCOUNTER — Encounter (HOSPITAL_BASED_OUTPATIENT_CLINIC_OR_DEPARTMENT_OTHER): Payer: Self-pay | Admitting: *Deleted

## 2011-08-08 ENCOUNTER — Other Ambulatory Visit: Payer: Self-pay

## 2011-08-08 ENCOUNTER — Encounter (HOSPITAL_BASED_OUTPATIENT_CLINIC_OR_DEPARTMENT_OTHER): Payer: Self-pay | Admitting: Anesthesiology

## 2011-08-08 ENCOUNTER — Encounter (HOSPITAL_BASED_OUTPATIENT_CLINIC_OR_DEPARTMENT_OTHER)
Admission: RE | Disposition: A | Discharge status: Home / Self Care | Payer: Self-pay | Source: Ambulatory Visit | Attending: Urology

## 2011-08-08 DIAGNOSIS — Z8551 Personal history of malignant neoplasm of bladder: Secondary | ICD-10-CM | POA: Insufficient documentation

## 2011-08-08 DIAGNOSIS — Z79899 Other long term (current) drug therapy: Secondary | ICD-10-CM | POA: Insufficient documentation

## 2011-08-08 DIAGNOSIS — K219 Gastro-esophageal reflux disease without esophagitis: Secondary | ICD-10-CM | POA: Insufficient documentation

## 2011-08-08 DIAGNOSIS — D494 Neoplasm of unspecified behavior of bladder: Secondary | ICD-10-CM | POA: Insufficient documentation

## 2011-08-08 HISTORY — PX: TRANSURETHRAL RESECTION OF BLADDER TUMOR: SHX2575

## 2011-08-08 LAB — POCT HEMOGLOBIN-HEMACUE: Hemoglobin: 16 g/dL (ref 13.0–17.0)

## 2011-08-08 SURGERY — CYSTOSCOPY
Anesthesia: General | Site: Bladder | Wound class: Clean Contaminated

## 2011-08-08 MED ORDER — DEXAMETHASONE SODIUM PHOSPHATE 4 MG/ML IJ SOLN
INTRAMUSCULAR | Status: DC | PRN
  Administered 2011-08-08: 10 mg via INTRAVENOUS

## 2011-08-08 MED ORDER — PROMETHAZINE HCL 25 MG/ML IJ SOLN: 6.2500 mg | INTRAMUSCULAR | Status: DC | PRN

## 2011-08-08 MED ORDER — CEFAZOLIN SODIUM 1-5 GM-% IV SOLN: 1.0000 g | INTRAVENOUS | Status: DC

## 2011-08-08 MED ORDER — KETOROLAC TROMETHAMINE 30 MG/ML IJ SOLN: 15.0000 mg | Freq: Once | INTRAMUSCULAR | Status: DC | PRN

## 2011-08-08 MED ORDER — GLYCINE 1.5 % IR SOLN
Status: DC | PRN
  Administered 2011-08-08: 3000 mL

## 2011-08-08 MED ORDER — ONDANSETRON HCL 4 MG/2ML IJ SOLN
INTRAMUSCULAR | Status: DC | PRN
  Administered 2011-08-08: 4 mg via INTRAVENOUS

## 2011-08-08 MED ORDER — FENTANYL CITRATE 0.05 MG/ML IJ SOLN
INTRAMUSCULAR | Status: DC | PRN
  Administered 2011-08-08: 50 ug via INTRAVENOUS
  Administered 2011-08-08 (×2): 25 ug via INTRAVENOUS
  Administered 2011-08-08 (×2): 50 ug via INTRAVENOUS
  Administered 2011-08-08 (×2): 25 ug via INTRAVENOUS
  Administered 2011-08-08: 50 ug via INTRAVENOUS

## 2011-08-08 MED ORDER — LIDOCAINE HCL (CARDIAC) 20 MG/ML IV SOLN
INTRAVENOUS | Status: DC | PRN
  Administered 2011-08-08: 80 mg via INTRAVENOUS

## 2011-08-08 MED ORDER — PHENAZOPYRIDINE HCL 200 MG PO TABS
200.0000 mg | ORAL_TABLET | Freq: Three times a day (TID) | ORAL | Status: DC
  Administered 2011-08-08: 200 mg via ORAL

## 2011-08-08 MED ORDER — LACTATED RINGERS IV SOLN
INTRAVENOUS | Status: DC
  Administered 2011-08-08 (×3): via INTRAVENOUS

## 2011-08-08 MED ORDER — SODIUM CHLORIDE 0.9 % IR SOLN
Status: DC | PRN
  Administered 2011-08-08: 3000 mL

## 2011-08-08 MED ORDER — FENTANYL CITRATE 0.05 MG/ML IJ SOLN: 25.0000 ug | INTRAMUSCULAR | Status: DC | PRN

## 2011-08-08 MED ORDER — PROPOFOL 10 MG/ML IV EMUL
INTRAVENOUS | Status: DC | PRN
  Administered 2011-08-08: 240 mg via INTRAVENOUS
  Administered 2011-08-08 (×2): 50 mg via INTRAVENOUS

## 2011-08-08 MED ORDER — CEFAZOLIN SODIUM 1-5 GM-% IV SOLN
1.0000 g | INTRAVENOUS | Status: AC
  Administered 2011-08-08: 2 g via INTRAVENOUS

## 2011-08-08 SURGICAL SUPPLY — 32 items
BAG DRAIN URO-CYSTO SKYTR STRL (DRAIN) IMPLANT
BAG URINE DRAINAGE (UROLOGICAL SUPPLIES) IMPLANT
BAG URINE LEG 19OZ MD ST LTX (BAG) IMPLANT
CANISTER SUCT LVC 12 LTR MEDI- (MISCELLANEOUS) ×2 IMPLANT
CATH FOLEY 2WAY SLVR  5CC 20FR (CATHETERS)
CATH FOLEY 2WAY SLVR  5CC 22FR (CATHETERS)
CATH FOLEY 2WAY SLVR 5CC 20FR (CATHETERS) IMPLANT
CATH FOLEY 2WAY SLVR 5CC 22FR (CATHETERS) IMPLANT
CLOTH BEACON ORANGE TIMEOUT ST (SAFETY) ×2 IMPLANT
DRAPE CAMERA CLOSED 9X96 (DRAPES) ×2 IMPLANT
ELECT LOOP HF 26F 30D .35MM (CUTTING LOOP) IMPLANT
ELECT LOOP MED HF 24F 12D (CUTTING LOOP) IMPLANT
ELECT REM PT RETURN 9FT ADLT (ELECTROSURGICAL) ×2
ELECTRODE REM PT RTRN 9FT ADLT (ELECTROSURGICAL) ×1 IMPLANT
EVACUATOR MICROVAS BLADDER (UROLOGICAL SUPPLIES) ×2 IMPLANT
GLOVE BIO SURGEON STRL SZ7 (GLOVE) ×2 IMPLANT
GLOVE ECLIPSE 7.0 STRL STRAW (GLOVE) ×4 IMPLANT
GLYCINE 1.5% IRRIG UROMATIC (IV SOLUTION) ×4 IMPLANT
HOLDER FOLEY CATH W/STRAP (MISCELLANEOUS) IMPLANT
IV NS IRRIG 3000ML ARTHROMATIC (IV SOLUTION) ×2 IMPLANT
KIT ASPIRATION TUBING (SET/KITS/TRAYS/PACK) IMPLANT
LOOP CUTTING 24FR OLYMPUS (CUTTING LOOP) IMPLANT
NDL SAFETY ECLIPSE 18X1.5 (NEEDLE) IMPLANT
NEEDLE HYPO 18GX1.5 SHARP (NEEDLE)
NEEDLE HYPO 22GX1.5 SAFETY (NEEDLE) IMPLANT
NS IRRIG 500ML POUR BTL (IV SOLUTION) IMPLANT
PACK CYSTOSCOPY (CUSTOM PROCEDURE TRAY) ×2 IMPLANT
PLUG CATH AND CAP STER (CATHETERS) IMPLANT
SET ASPIRATION TUBING (TUBING) IMPLANT
SYR 20CC LL (SYRINGE) IMPLANT
SYRINGE IRR TOOMEY STRL 70CC (SYRINGE) IMPLANT
WATER STERILE IRR 3000ML UROMA (IV SOLUTION) ×2 IMPLANT

## 2011-08-08 NOTE — Transfer of Care (Signed)
Immediate Anesthesia Transfer of Care Note  Patient: Richard Brewer  Procedure(s) Performed: Procedure(s) (LRB): CYSTOSCOPY (N/A) TRANSURETHRAL RESECTION OF BLADDER TUMOR (TURBT) (N/A)  Patient Location: Patient transported to PACU with oxygen via face mask at 4 Liters / Min  Anesthesia Type: General  Level of Consciousness: awake and alert   Airway & Oxygen Therapy: Patient Spontanous Breathing and Patient connected to face mask oxygen  Post-op Assessment: Report given to PACU RN and Post -op Vital signs reviewed and stable  Post vital signs: Reviewed and stable  Dentition: Teeth and oropharynx remain in pre-op condition  Complications: No apparent anesthesia complications

## 2011-08-08 NOTE — Op Note (Signed)
Romelo Sciandra is a 51 y.o.   08/08/2011  Pre Op Diagnosis: Rule out recurrent bladder tumor.  Post Op Diagnosis: No recurrent bladder tumor.  Procedure: Cystoscopy, TUR bladder biopsy, random bladder biopsy, bladder washings for cytology.  Surgeon: Wendie Simmer. Myracle Febres  Anesthesia: Gen.  Indication: Patient is a 50 years old male who had a TUR bladder tumor 3 months ago for high-grade TCC of the bladder. The tumor involved the left posterior wall of the bladder and the ureteral orifice was  also involved. A double-J stent was left in the left renal unit. The stent was removed about 3 weeks later. He had intravesical BCG. He is scheduled today for TUR of the bladder to rule out recurrent bladder tumor.  Procedure: Patient was identified by his wrist band and proper timeout was taken.  Under general anesthesia he was prepped and draped and placed in the dorsolithotomy position. A panendoscope was inserted in the bladder. The anterior urethra is normal; there is moderate prostatic hypertrophy. The bladder mucosa is normal, there is no stone or tumor in the bladder. There was no evidence of gross tumor at the site of the previous bladder tumor. The left ureteral orifice is normal and patent. The ureteral orifices are in normal position and shape.   The cystoscope was then removed. The urethra was dilated up to #28 [place] with Sissy Hoff sounds. Then a # 28 resectoscope was inserted in the bladder. Resection of the site of the previous bladder tumor was done. The left ureteral orifice was not resected this time. Hemostasis was secured with electrocautery. The resectoscope was then removed. The cystoscope was reinserted in the bladder and a random bladder biopsy was done with the cold cup biopsy forceps. Fulguration of the sites of bladder biopsy was then done with the Bugbee electrode. There was no evidence of bleeding at the end of the procedure. The bladder was then irrigated with normal saline and bladder  washings were sent for cytology.  The patient tolerated the procedure well and left the OR in satisfactory condition to postanesthesia care unit.  EBL:

## 2011-08-08 NOTE — Anesthesia Preprocedure Evaluation (Signed)
Anesthesia Evaluation  Patient identified by MRN, date of birth, ID band Patient awake    Reviewed: Allergy & Precautions, H&P , NPO status , Patient's Chart, lab work & pertinent test results  Airway Mallampati: II TM Distance: >3 FB Neck ROM: Full    Dental No notable dental hx.    Pulmonary neg pulmonary ROS,  clear to auscultation  Pulmonary exam normal       Cardiovascular neg cardio ROS Regular Normal    Neuro/Psych Negative Neurological ROS  Negative Psych ROS   GI/Hepatic negative GI ROS, Neg liver ROS,   Endo/Other  Negative Endocrine ROS  Renal/GU negative Renal ROS  Genitourinary negative   Musculoskeletal negative musculoskeletal ROS (+)   Abdominal   Peds negative pediatric ROS (+)  Hematology negative hematology ROS (+)   Anesthesia Other Findings   Reproductive/Obstetrics negative OB ROS                           Anesthesia Physical Anesthesia Plan  ASA: II  Anesthesia Plan: General   Post-op Pain Management:    Induction: Intravenous  Airway Management Planned: LMA  Additional Equipment:   Intra-op Plan:   Post-operative Plan:   Informed Consent:   Dental advisory given  Plan Discussed with: CRNA  Anesthesia Plan Comments:         Anesthesia Quick Evaluation

## 2011-08-08 NOTE — Anesthesia Postprocedure Evaluation (Signed)
  Anesthesia Post-op Note  Patient: Richard Brewer  Procedure(s) Performed: Procedure(s) (LRB): CYSTOSCOPY (N/A) TRANSURETHRAL RESECTION OF BLADDER TUMOR (TURBT) (N/A)  Patient Location: PACU  Anesthesia Type: General  Level of Consciousness: awake and alert   Airway and Oxygen Therapy: Patient Spontanous Breathing  Post-op Pain: mild  Post-op Assessment: Post-op Vital signs reviewed, Patient's Cardiovascular Status Stable, Respiratory Function Stable, Patent Airway and No signs of Nausea or vomiting  Post-op Vital Signs: stable  Complications: No apparent anesthesia complications

## 2011-08-08 NOTE — Anesthesia Procedure Notes (Signed)
Procedure Name: LMA Insertion Date/Time: 08/08/2011 11:00 AM Performed by: Lorrin Jackson Pre-anesthesia Checklist: Patient identified, Emergency Drugs available, Suction available and Patient being monitored Patient Re-evaluated:Patient Re-evaluated prior to inductionOxygen Delivery Method: Circle System Utilized Preoxygenation: Pre-oxygenation with 100% oxygen Intubation Type: IV induction Ventilation: Mask ventilation without difficulty LMA: LMA with gastric port inserted LMA Size: 4.0 Number of attempts: 1 Placement Confirmation: positive ETCO2 Tube secured with: Tape Dental Injury: Teeth and Oropharynx as per pre-operative assessment

## 2011-08-12 ENCOUNTER — Encounter (HOSPITAL_BASED_OUTPATIENT_CLINIC_OR_DEPARTMENT_OTHER): Payer: Self-pay | Admitting: Urology

## 2011-08-15 ENCOUNTER — Encounter (HOSPITAL_BASED_OUTPATIENT_CLINIC_OR_DEPARTMENT_OTHER): Payer: Self-pay

## 2011-11-26 ENCOUNTER — Encounter (HOSPITAL_BASED_OUTPATIENT_CLINIC_OR_DEPARTMENT_OTHER): Payer: Self-pay | Admitting: *Deleted

## 2011-11-26 ENCOUNTER — Other Ambulatory Visit: Payer: Self-pay | Admitting: Urology

## 2011-11-26 NOTE — Progress Notes (Signed)
NPO AFTER MN. ARRIVES AT 0730. NEEDS HG. CURRENT EKG IN EPIC.

## 2011-11-27 NOTE — H&P (Signed)
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  He had intravesical BCG.  Repeta cysto with biopsy in February showed no recurrence.  He has been voiding well.  He denies gross hematuria.  Cystoscopy today shows a 3 cm tumor on the left lateral wall of the bladder above the ureteral orifice.   Past Medical History Problems  1. History of  Esophageal Reflux 530.81  Surgical History Problems  1. History of  Cystoscopy With Biopsy 2. History of  Cystoscopy With Fulguration Large Lesion (Over 5cm) 3. History of  Cystoscopy With Insertion Of Ureteral Stent Left 4. History of  Hernia Repair  Current Meds 1. Robaxin 500 MG Oral Tablet; Therapy: (Recorded:16Oct2012) to 2. TraMADol HCl 50 MG Oral Tablet; Therapy: (Recorded:16Oct2012) to  Allergies Medication  1. No Known Drug Allergies  Family History Problems  1. Paternal history of  Chronic Obstructive Pulmonary Disease 2. Family history of  Death In The Family Father 3. Fraternal history of  Diabetes Mellitus V18.0 4. Family history of  Family Health Status - Mother's Age age 43 5. Family history of  Family Health Status Number Of Children 1 daughter 53. Fraternal history of  Hypertension V17.49  Social History Problems  1. Alcohol Use 2 per day 2. Caffeine Use 1-2 per day 3. Former Smoker V15.82 1 PPD smoked for 54yrs quit 23yrs ago 4. Marital History - Currently Married  Review of Systems Genitourinary, constitutional, skin, eye, otolaryngeal, hematologic/lymphatic, cardiovascular, pulmonary, endocrine, musculoskeletal, gastrointestinal, neurological and psychiatric system(s) were reviewed and pertinent findings if present are noted.    Vitals Vital Signs [Data Includes: Last 1 Day]  12Jun2013 10:53AM  Blood Pressure: 149 / 95 Temperature: 97.2 F Heart Rate: 78  Physical Exam Constitutional: Well nourished and well developed . No acute  distress.  ENT:. The ears and nose are normal in appearance.  Neck: The appearance of the neck is normal and no neck mass is present.  Pulmonary: No respiratory distress and normal respiratory rhythm and effort.  Cardiovascular: Heart rate and rhythm are normal . No peripheral edema.  Abdomen: The abdomen is soft and nontender. No masses are palpated. No CVA tenderness. No hernias are palpable. No hepatosplenomegaly noted.  Rectal: Rectal exam demonstrates no tenderness and no masses. The prostate is tender. The left seminal vesicle is nonpalpable. The right seminal vesicle is nonpalpable. The perineum is normal on inspection.  Genitourinary: Examination of the penis demonstrates no discharge, no masses, no lesions and a normal meatus. The scrotum is without lesions. The right epididymis is palpably normal and non-tender. The left epididymis is palpably normal and non-tender. The right testis is non-tender and without masses. The left testis is non-tender and without masses.  Lymphatics: The femoral and inguinal nodes are not enlarged or tender.  Skin: Normal skin turgor, no visible rash and no visible skin lesions.  Neuro/Psych:. Mood and affect are appropriate.    Results/Data Urine [Data Includes: Last 1 Day]   12Jun2013  COLOR YELLOW   APPEARANCE CLEAR   SPECIFIC GRAVITY 1.010   pH 6.0   GLUCOSE NEG mg/dL  BILIRUBIN NEG   KETONE NEG mg/dL  BLOOD NEG   PROTEIN NEG mg/dL  UROBILINOGEN 0.2 mg/dL  NITRITE NEG   LEUKOCYTE ESTERASE NEG    Procedure  Procedure: Cystoscopy   Indication: History of Urothelial Carcinoma.  Informed Consent: Risks, benefits, and potential adverse events were discussed and informed consent was obtained  from the patient . Specific risks including, but not limited to bleeding, infection, pain, allergic reaction etc. were explained.  Prep: The patient was prepped with betadine.  Anesthesia:. Local anesthesia was administered intraurethrally with 2% lidocaine  jelly.  Antibiotic prophylaxis: Ciprofloxacin.  Procedure Note:  Urethral meatus:. No abnormalities.  Anterior urethra: No abnormalities.  Prostatic urethra: No abnormalities.  Bladder: Visulization was clear. A solitary tumor was visualized in the bladder. A papillary tumor was seen in the bladder measuring approximately 3 cm in size. This tumor was located on the left side, on the posterior aspect, at the base of the bladder. The patient tolerated the procedure well.    Assessment Assessed  1. Bladder Cancer 188.9  Plan Health Maintenance (V70.0)  1. UA With REFLEX  Done: 12Jun2013 10:22AM   Cysto TURBT.   Signatures Electronically signed by : Su Grand, M.D.; Nov 26 2011 11:38AM

## 2011-11-28 ENCOUNTER — Ambulatory Visit (HOSPITAL_BASED_OUTPATIENT_CLINIC_OR_DEPARTMENT_OTHER)
Admission: RE | Admit: 2011-11-28 | Discharge: 2011-11-28 | Disposition: A | Discharge status: Home / Self Care | Payer: 59 | Source: Ambulatory Visit | Attending: Urology | Admitting: Urology

## 2011-11-28 ENCOUNTER — Encounter (HOSPITAL_BASED_OUTPATIENT_CLINIC_OR_DEPARTMENT_OTHER)
Admission: RE | Disposition: A | Discharge status: Home / Self Care | Payer: Self-pay | Source: Ambulatory Visit | Attending: Urology

## 2011-11-28 ENCOUNTER — Encounter (HOSPITAL_BASED_OUTPATIENT_CLINIC_OR_DEPARTMENT_OTHER): Payer: Self-pay | Admitting: Anesthesiology

## 2011-11-28 ENCOUNTER — Encounter (HOSPITAL_BASED_OUTPATIENT_CLINIC_OR_DEPARTMENT_OTHER): Payer: Self-pay | Admitting: *Deleted

## 2011-11-28 ENCOUNTER — Ambulatory Visit (HOSPITAL_BASED_OUTPATIENT_CLINIC_OR_DEPARTMENT_OTHER): Payer: 59 | Admitting: Anesthesiology

## 2011-11-28 DIAGNOSIS — Z79899 Other long term (current) drug therapy: Secondary | ICD-10-CM | POA: Insufficient documentation

## 2011-11-28 DIAGNOSIS — C679 Malignant neoplasm of bladder, unspecified: Secondary | ICD-10-CM | POA: Insufficient documentation

## 2011-11-28 DIAGNOSIS — K219 Gastro-esophageal reflux disease without esophagitis: Secondary | ICD-10-CM | POA: Insufficient documentation

## 2011-11-28 HISTORY — PX: TRANSURETHRAL RESECTION OF BLADDER TUMOR: SHX2575

## 2011-11-28 HISTORY — DX: Personal history of traumatic brain injury: Z87.820

## 2011-11-28 HISTORY — PX: CYSTOSCOPY: SHX5120

## 2011-11-28 SURGERY — TURBT (TRANSURETHRAL RESECTION OF BLADDER TUMOR)
Anesthesia: General | Site: Bladder | Wound class: Clean Contaminated

## 2011-11-28 MED ORDER — ONDANSETRON HCL 4 MG/2ML IJ SOLN
INTRAMUSCULAR | Status: DC | PRN
  Administered 2011-11-28: 4 mg via INTRAVENOUS

## 2011-11-28 MED ORDER — DEXAMETHASONE SODIUM PHOSPHATE 4 MG/ML IJ SOLN
INTRAMUSCULAR | Status: DC | PRN
  Administered 2011-11-28: 10 mg via INTRAVENOUS

## 2011-11-28 MED ORDER — CEFAZOLIN SODIUM 1-5 GM-% IV SOLN: 1.0000 g | INTRAVENOUS | Status: DC

## 2011-11-28 MED ORDER — LIDOCAINE HCL (CARDIAC) 20 MG/ML IV SOLN
INTRAVENOUS | Status: DC | PRN
  Administered 2011-11-28: 70 mg via INTRAVENOUS

## 2011-11-28 MED ORDER — KETOROLAC TROMETHAMINE 30 MG/ML IJ SOLN
INTRAMUSCULAR | Status: DC | PRN
  Administered 2011-11-28: 30 mg via INTRAVENOUS

## 2011-11-28 MED ORDER — LIDOCAINE HCL 2 % EX GEL
CUTANEOUS | Status: DC | PRN
  Administered 2011-11-28: 1 via URETHRAL

## 2011-11-28 MED ORDER — MIDAZOLAM HCL 5 MG/5ML IJ SOLN
INTRAMUSCULAR | Status: DC | PRN
  Administered 2011-11-28: 2 mg via INTRAVENOUS

## 2011-11-28 MED ORDER — LACTATED RINGERS IV SOLN
INTRAVENOUS | Status: DC
  Administered 2011-11-28 (×2): via INTRAVENOUS

## 2011-11-28 MED ORDER — FENTANYL CITRATE 0.05 MG/ML IJ SOLN: 25.0000 ug | INTRAMUSCULAR | Status: DC | PRN

## 2011-11-28 MED ORDER — FENTANYL CITRATE 0.05 MG/ML IJ SOLN
INTRAMUSCULAR | Status: DC | PRN
  Administered 2011-11-28 (×2): 25 ug via INTRAVENOUS
  Administered 2011-11-28: 100 ug via INTRAVENOUS
  Administered 2011-11-28 (×2): 25 ug via INTRAVENOUS

## 2011-11-28 MED ORDER — CEFAZOLIN SODIUM-DEXTROSE 2-3 GM-% IV SOLR
2.0000 g | INTRAVENOUS | Status: AC
  Administered 2011-11-28: 2 g via INTRAVENOUS

## 2011-11-28 MED ORDER — SODIUM CHLORIDE 0.9 % IR SOLN
Status: DC | PRN
  Administered 2011-11-28: 6000 mL via INTRAVESICAL

## 2011-11-28 MED ORDER — PROPOFOL 10 MG/ML IV EMUL
INTRAVENOUS | Status: DC | PRN
  Administered 2011-11-28: 220 mg via INTRAVENOUS

## 2011-11-28 SURGICAL SUPPLY — 36 items
BAG DRAIN URO-CYSTO SKYTR STRL (DRAIN) ×2 IMPLANT
BAG URINE DRAINAGE (UROLOGICAL SUPPLIES) IMPLANT
BAG URINE LEG 19OZ MD ST LTX (BAG) IMPLANT
CANISTER SUCT LVC 12 LTR MEDI- (MISCELLANEOUS) ×4 IMPLANT
CATH FOLEY 2WAY SLVR  5CC 16FR (CATHETERS) ×1
CATH FOLEY 2WAY SLVR  5CC 20FR (CATHETERS)
CATH FOLEY 2WAY SLVR  5CC 22FR (CATHETERS)
CATH FOLEY 2WAY SLVR 5CC 16FR (CATHETERS) ×1 IMPLANT
CATH FOLEY 2WAY SLVR 5CC 20FR (CATHETERS) IMPLANT
CATH FOLEY 2WAY SLVR 5CC 22FR (CATHETERS) IMPLANT
CLOTH BEACON ORANGE TIMEOUT ST (SAFETY) ×2 IMPLANT
DRAPE CAMERA CLOSED 9X96 (DRAPES) ×2 IMPLANT
ELECT LOOP HF 26F 30D .35MM (CUTTING LOOP) IMPLANT
ELECT LOOP MED HF 24F 12D CBL (CLIP) ×2 IMPLANT
ELECT REM PT RETURN 9FT ADLT (ELECTROSURGICAL)
ELECTRODE REM PT RTRN 9FT ADLT (ELECTROSURGICAL) IMPLANT
EVACUATOR MICROVAS BLADDER (UROLOGICAL SUPPLIES) IMPLANT
GLOVE BIO SURGEON STRL SZ7 (GLOVE) ×2 IMPLANT
GLOVE BIOGEL PI IND STRL 7.0 (GLOVE) ×1 IMPLANT
GLOVE BIOGEL PI INDICATOR 7.0 (GLOVE) ×1
GLOVE ECLIPSE 6.5 STRL STRAW (GLOVE) ×2 IMPLANT
GLOVE INDICATOR 6.5 STRL GRN (GLOVE) ×4 IMPLANT
HOLDER FOLEY CATH W/STRAP (MISCELLANEOUS) IMPLANT
IV NS IRRIG 3000ML ARTHROMATIC (IV SOLUTION) ×6 IMPLANT
KIT ASPIRATION TUBING (SET/KITS/TRAYS/PACK) ×2 IMPLANT
LOOP CUTTING 24FR OLYMPUS (CUTTING LOOP) IMPLANT
NDL SAFETY ECLIPSE 18X1.5 (NEEDLE) IMPLANT
NEEDLE HYPO 18GX1.5 SHARP (NEEDLE)
NEEDLE HYPO 22GX1.5 SAFETY (NEEDLE) IMPLANT
NS IRRIG 500ML POUR BTL (IV SOLUTION) ×2 IMPLANT
PACK CYSTOSCOPY (CUSTOM PROCEDURE TRAY) ×2 IMPLANT
PLUG CATH AND CAP STER (CATHETERS) IMPLANT
SET ASPIRATION TUBING (TUBING) ×2 IMPLANT
SYR 20CC LL (SYRINGE) IMPLANT
SYRINGE IRR TOOMEY STRL 70CC (SYRINGE) ×2 IMPLANT
WATER STERILE IRR 3000ML UROMA (IV SOLUTION) IMPLANT

## 2011-11-28 NOTE — Anesthesia Procedure Notes (Signed)
Procedure Name: LMA Insertion Date/Time: 11/28/2011 8:54 AM Performed by: Norva Pavlov Pre-anesthesia Checklist: Patient identified, Emergency Drugs available, Suction available and Patient being monitored Patient Re-evaluated:Patient Re-evaluated prior to inductionOxygen Delivery Method: Circle System Utilized Preoxygenation: Pre-oxygenation with 100% oxygen Intubation Type: IV induction Ventilation: Mask ventilation without difficulty LMA: LMA inserted LMA Size: 4.0 Number of attempts: 1 Airway Equipment and Method: bite block Placement Confirmation: positive ETCO2 Tube secured with: Tape Dental Injury: Teeth and Oropharynx as per pre-operative assessment

## 2011-11-28 NOTE — Discharge Instructions (Signed)
°  Post Anesthesia Home Care Instructions ° °Activity: °Get plenty of rest for the remainder of the day. A responsible adult should stay with you for 24 hours following the procedure.  °For the next 24 hours, DO NOT: °-Drive a car °-Operate machinery °-Drink alcoholic beverages °-Take any medication unless instructed by your physician °-Make any legal decisions or sign important papers. ° °Meals: °Start with liquid foods such as gelatin or soup. Progress to regular foods as tolerated. Avoid greasy, spicy, heavy foods. If nausea and/or vomiting occur, drink only clear liquids until the nausea and/or vomiting subsides. Call your physician if vomiting continues. ° °Special Instructions/Symptoms: °Your throat may feel dry or sore from the anesthesia or the breathing tube placed in your throat during surgery. If this causes discomfort, gargle with warm salt water. The discomfort should disappear within 24 hours. °CYSTOSCOPY HOME CARE INSTRUCTIONS ° °Activity: °Rest for the remainder of the day.  Do not drive or operate equipment today.  You may resume normal activities in one to two days as instructed by your physician.  ° °Meals: °Drink plenty of liquids and eat light foods such as gelatin or soup this evening.  You may return to a normal meal plan tomorrow. ° °Return to Work: °You may return to work in one to two days or as instructed by your physician. ° °Special Instructions / Symptoms: °Call your physician if any of these symptoms occur: ° ° -persistent or heavy bleeding ° -bleeding which continues after first few urination ° -large blood clots that are difficult to pass ° -urine stream diminishes or stops completely ° -fever equal to or higher than 101 degrees Farenheit. ° -cloudy urine with a strong, foul odor ° -severe pain ° °Females should always wipe from front to back after elimination.  You may feel some burning pain when you urinate.  This should disappear with time.  Applying moist heat to the lower abdomen  or a hot tub bath may help relieve the pain. \ ° °Follow-Up / Date of Return Visit to Your Physician:  *** °Call for an appointment to arrange follow-up. ° °Patient Signature:  ________________________________________________________ ° °Nurse's Signature:  ________________________________________________________ ° °

## 2011-11-28 NOTE — Op Note (Signed)
Richard Brewer is a 51 y.o.   11/28/2011  Pre-op diagnosis: Recurrent bladder tumor  Postop diagnosis: Same  Procedure done: Cystoscopy, TUR bladder tumor, random bladder biopsy.  Surgeon: Wendie Simmer. Arsh Feutz  Anesthesia: General   Indication: The patient is a 51 years old male who had a TUR bladder tumor in November 2012 for high-grade TCC of the bladder. He was treated with intravesical BCG. Cystoscopy and bladder biopsy in February 2013 showed no recurrent bladder tumor. Cystoscopy on 6/11 showed a small papillary tumor on the left side of the bladder above the ureteral orifice. He is scheduled for cystoscopy and TURBT.   Procedure: The patient was identified by his wrist band and proper timeout was taken.  Under general anesthesia he was prepped and draped and placed in the dorsolithotomy position. A panendoscope was inserted in the bladder. The anterior urethra is normal; he has moderate prostatic hypertrophy. There is a 2 cm papillary tumor on the left lateral wall of the bladder above the ureteral orifice. The ureteral orifices are in normal position and shape. There is no evidence of other tumor in the bladder. The cystoscope was removed. The urethra was then sequentially dilated up to #30 [place] with Von Buren sounds. Then a #28 gyrus resectoscope was inserted in the bladder. Resection of the papillary tumor on the left side of the bladder was done. The base of the bladder tumor was also resected for staging purposes. Specimen were sent in separate containers for definitive diagnosis. The base of the bladder tumor was then fulgurated as well as a 2 cm resection margins. The resectoscope was removed. The cystoscope was reinserted in the bladder. Random bladder biopsy of the bladder was then done. The cystoscope was removed. The resectoscope was then reinserted in the bladder and the areas of biopsy were cauterized with the resectoscope loop. There was no evidence of bleeding at the end of the  procedure. The resectoscope was then removed. Xylocaine jelly was then instilled in the urethra and a #16 [place] Foley catheter was placed in the bladder.  EBL: Minimal  The patient tolerated the procedure well and left the OR in satisfactory condition to postanesthesia care unit.

## 2011-11-28 NOTE — Anesthesia Postprocedure Evaluation (Signed)
  Anesthesia Post-op Note  Patient: Richard Brewer  Procedure(s) Performed: Procedure(s) (LRB): TRANSURETHRAL RESECTION OF BLADDER TUMOR (TURBT) (N/A) CYSTOSCOPY (N/A)  Patient Location: PACU  Anesthesia Type: General  Level of Consciousness: oriented and sedated  Airway and Oxygen Therapy: Patient Spontanous Breathing  Post-op Pain: mild  Post-op Assessment: Post-op Vital signs reviewed, Patient's Cardiovascular Status Stable, Respiratory Function Stable and Patent Airway  Post-op Vital Signs: stable  Complications: No apparent anesthesia complications

## 2011-11-28 NOTE — Transfer of Care (Signed)
Immediate Anesthesia Transfer of Care Note  Patient: Richard Brewer  Procedure(s) Performed: Procedure(s) (LRB): TRANSURETHRAL RESECTION OF BLADDER TUMOR (TURBT) (N/A) CYSTOSCOPY (N/A)  Patient Location: PACU  Anesthesia Type: General  Level of Consciousness: awake, alert  and oriented  Airway & Oxygen Therapy: Patient Spontanous Breathing and Patient connected to face mask oxygen  Post-op Assessment: Report given to PACU RN and Post -op Vital signs reviewed and stable  Post vital signs: Reviewed and stable  Complications: No apparent anesthesia complications

## 2011-11-28 NOTE — Anesthesia Preprocedure Evaluation (Signed)
Anesthesia Evaluation  Patient identified by MRN, date of birth, ID band Patient awake    Reviewed: Allergy & Precautions, H&P , NPO status , Patient's Chart, lab work & pertinent test results, reviewed documented beta blocker date and time   Airway Mallampati: II TM Distance: >3 FB Neck ROM: Full    Dental  (+) Dental Advisory Given   Pulmonary former smoker breath sounds clear to auscultation        Cardiovascular Rhythm:Regular Rate:Normal  Borderline BP, no Rx Denies cardiac symptoms   Neuro/Psych negative neurological ROS  negative psych ROS   GI/Hepatic negative GI ROS, Neg liver ROS,   Endo/Other  negative endocrine ROS  Renal/GU negative Renal ROS   Bladder lesion    Musculoskeletal negative musculoskeletal ROS (+)   Abdominal   Peds negative pediatric ROS (+)  Hematology negative hematology ROS (+)   Anesthesia Other Findings Upper front bridge  Reproductive/Obstetrics negative OB ROS                           Anesthesia Physical Anesthesia Plan  ASA: II  Anesthesia Plan: General   Post-op Pain Management:    Induction: Intravenous  Airway Management Planned: LMA  Additional Equipment:   Intra-op Plan:   Post-operative Plan: Extubation in OR  Informed Consent: I have reviewed the patients History and Physical, chart, labs and discussed the procedure including the risks, benefits and alternatives for the proposed anesthesia with the patient or authorized representative who has indicated his/her understanding and acceptance.   Dental advisory given  Plan Discussed with: CRNA and Surgeon  Anesthesia Plan Comments:         Anesthesia Quick Evaluation

## 2011-12-01 ENCOUNTER — Encounter (HOSPITAL_BASED_OUTPATIENT_CLINIC_OR_DEPARTMENT_OTHER): Payer: Self-pay | Admitting: Urology

## 2012-06-28 ENCOUNTER — Other Ambulatory Visit (HOSPITAL_COMMUNITY): Payer: Self-pay | Admitting: Urology

## 2012-06-28 DIAGNOSIS — N281 Cyst of kidney, acquired: Secondary | ICD-10-CM

## 2012-06-30 ENCOUNTER — Other Ambulatory Visit (HOSPITAL_COMMUNITY): Payer: Self-pay | Admitting: Urology

## 2012-06-30 ENCOUNTER — Ambulatory Visit (HOSPITAL_COMMUNITY): Payer: 59

## 2012-06-30 ENCOUNTER — Ambulatory Visit (HOSPITAL_COMMUNITY)
Admission: RE | Admit: 2012-06-30 | Discharge: 2012-06-30 | Disposition: A | Discharge status: Home / Self Care | Payer: 59 | Source: Ambulatory Visit | Attending: Urology | Admitting: Urology

## 2012-06-30 DIAGNOSIS — N281 Cyst of kidney, acquired: Secondary | ICD-10-CM

## 2012-07-02 ENCOUNTER — Other Ambulatory Visit (HOSPITAL_COMMUNITY): Payer: 59

## 2012-07-05 ENCOUNTER — Ambulatory Visit (HOSPITAL_COMMUNITY)
Admission: RE | Admit: 2012-07-05 | Discharge: 2012-07-05 | Disposition: A | Discharge status: Home / Self Care | Payer: 59 | Source: Ambulatory Visit | Attending: Urology | Admitting: Urology

## 2012-07-05 DIAGNOSIS — D1803 Hemangioma of intra-abdominal structures: Secondary | ICD-10-CM | POA: Insufficient documentation

## 2012-07-05 DIAGNOSIS — N281 Cyst of kidney, acquired: Secondary | ICD-10-CM | POA: Insufficient documentation

## 2012-07-05 LAB — CREATININE, SERUM: GFR calc non Af Amer: 82 mL/min — ABNORMAL LOW (ref >90)

## 2012-07-05 MED ORDER — GADOBENATE DIMEGLUMINE 529 MG/ML IV SOLN
20.0000 mL | Freq: Once | INTRAVENOUS | Status: AC | PRN
  Administered 2012-07-05: 19 mL via INTRAVENOUS

## 2013-04-19 DIAGNOSIS — I252 Old myocardial infarction: Secondary | ICD-10-CM

## 2013-04-19 HISTORY — DX: Old myocardial infarction: I25.2

## 2013-04-20 HISTORY — PX: CARDIAC CATHETERIZATION: SHX172

## 2013-10-27 HISTORY — PX: ACHILLES TENDON REPAIR: SUR1153

## 2014-02-16 IMAGING — US US RENAL
1 series · 14 of 25 positions shown · non-contrast
Comparison: None

CLINICAL DATA: Evaluate renal cyst

RENAL/URINARY TRACT ULTRASOUND COMPLETE

[Series 1: us renal · 0.32mm/px · 14 of 29 slices shown]
[im 1/29]
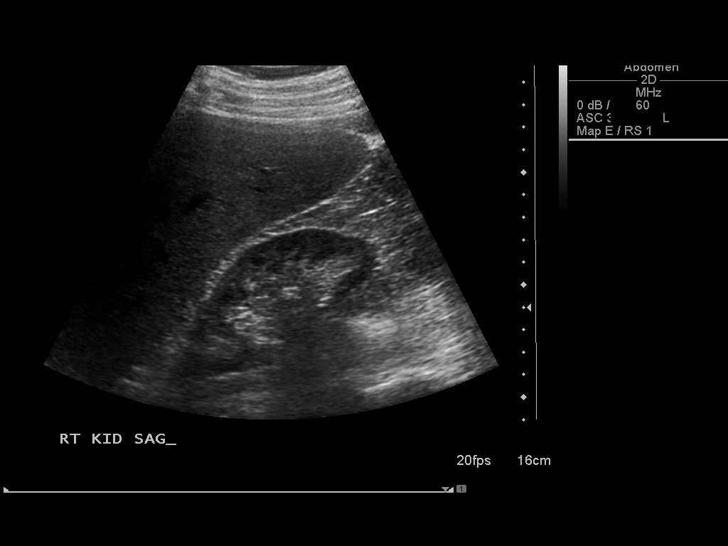
[im 3/29]
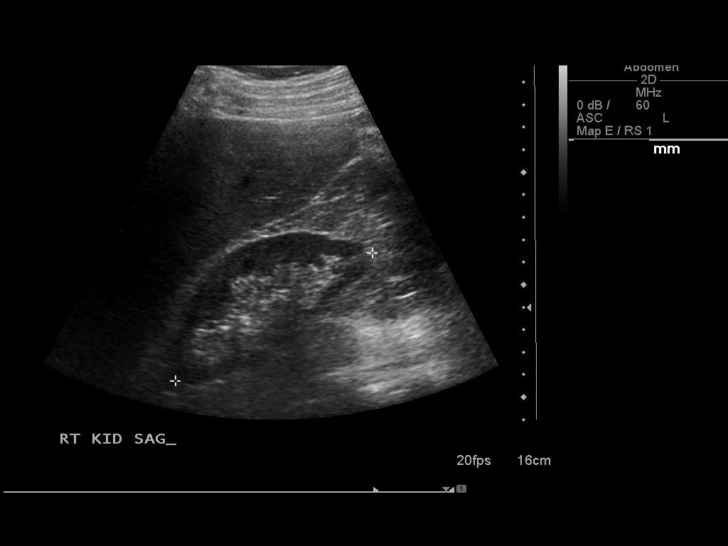
[im 5/29]
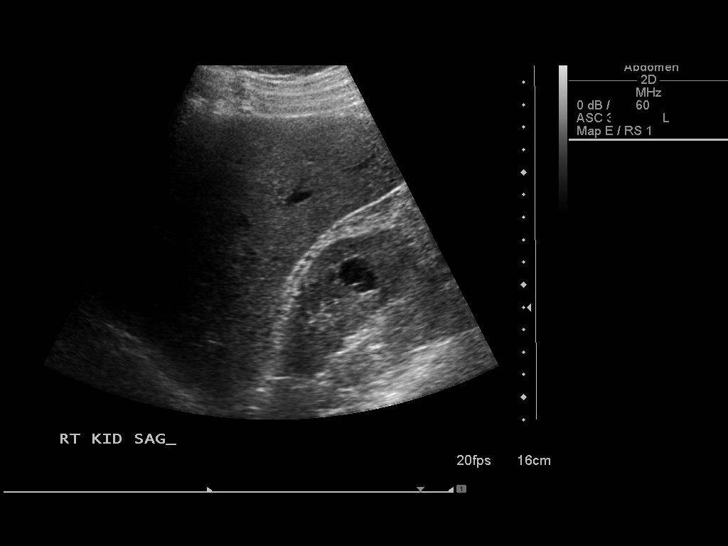
[im 8/29]
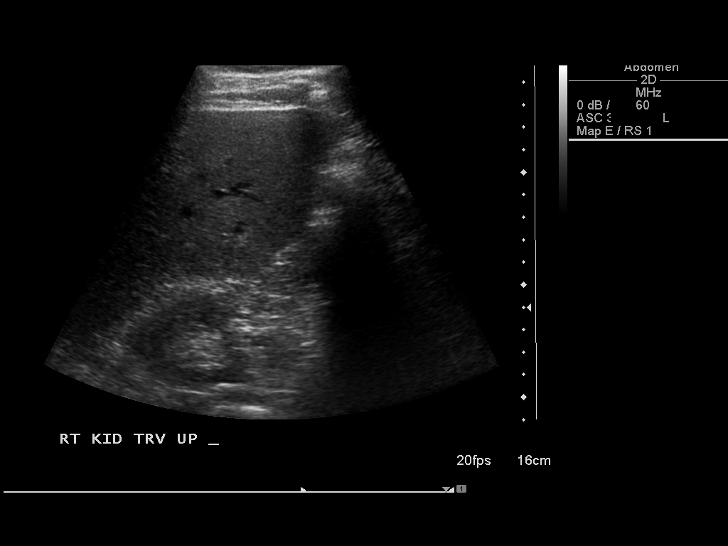
[im 10/29]
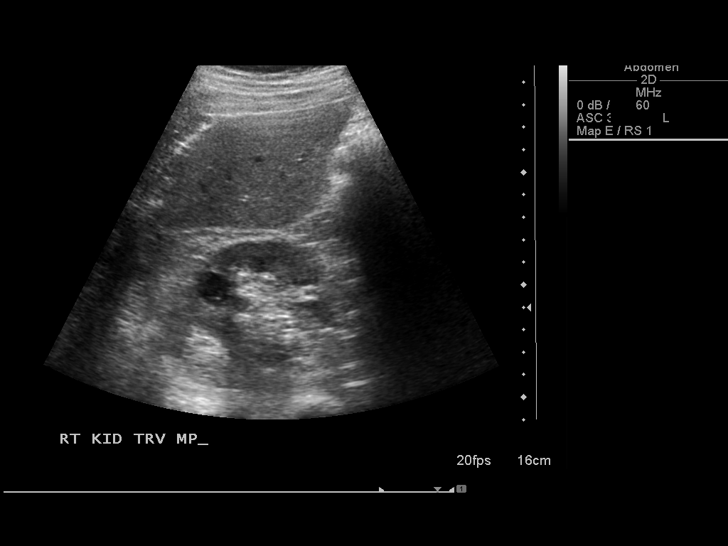
[im 11/29]
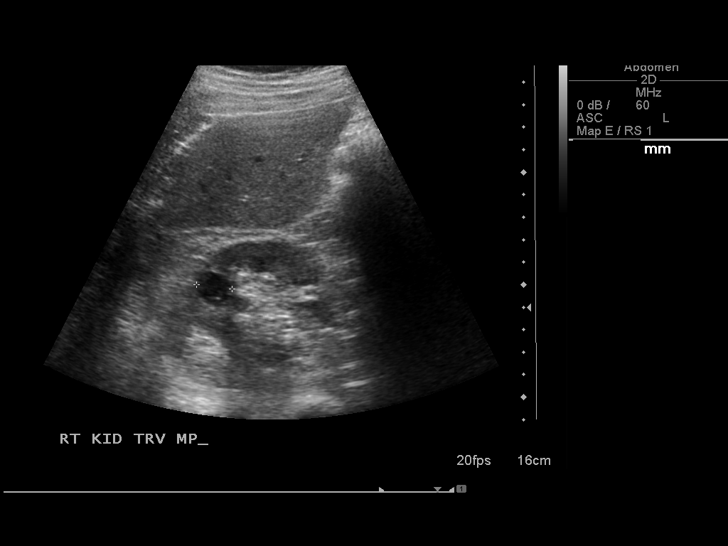
[im 13/29]
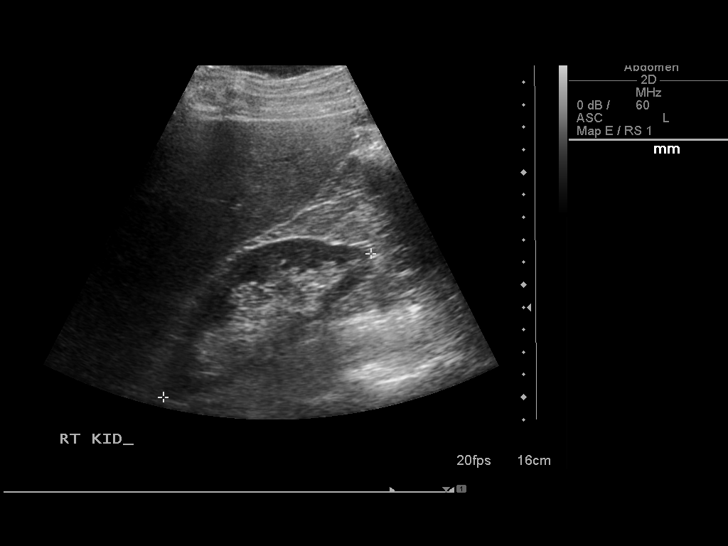
[im 16/29]
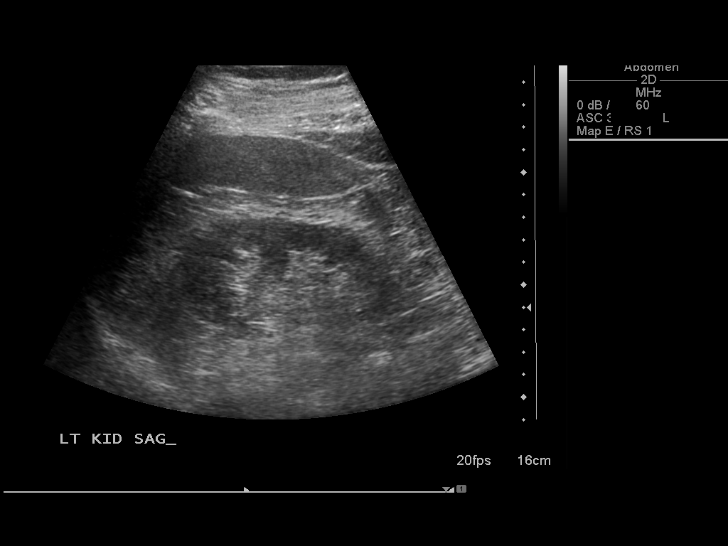
[im 18/29]
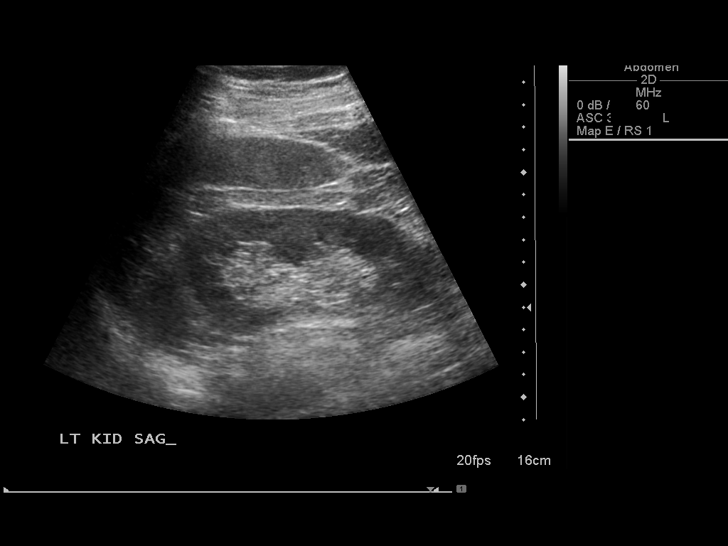
[im 19/29]
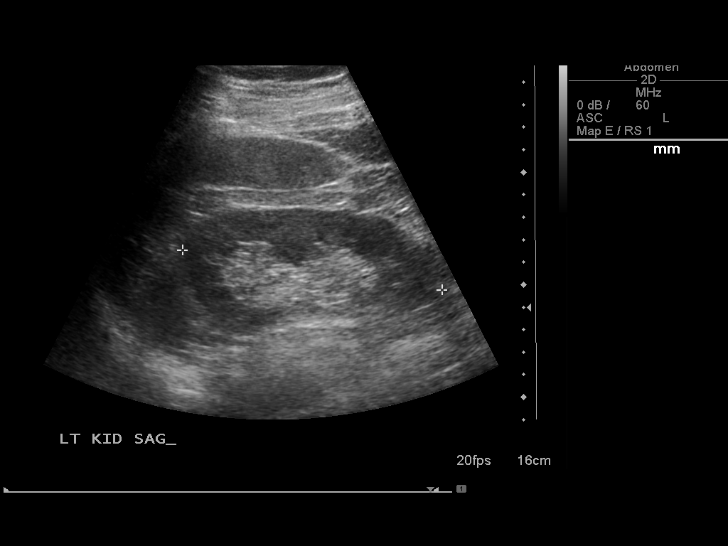
[im 22/29]
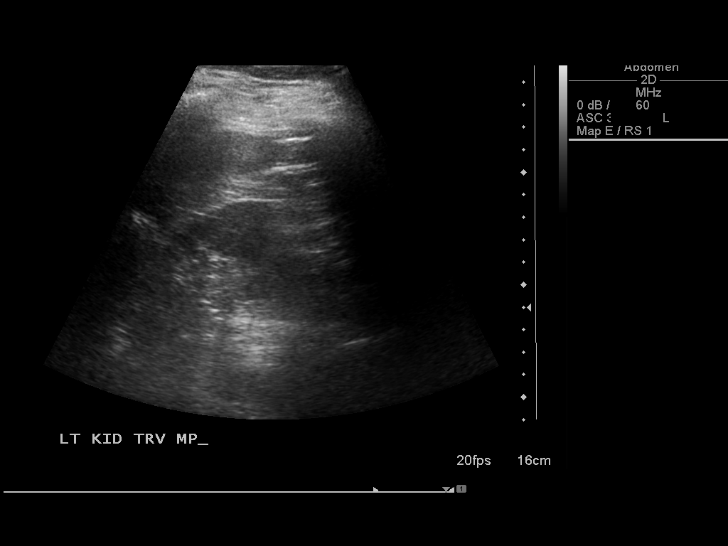
[im 24/29]
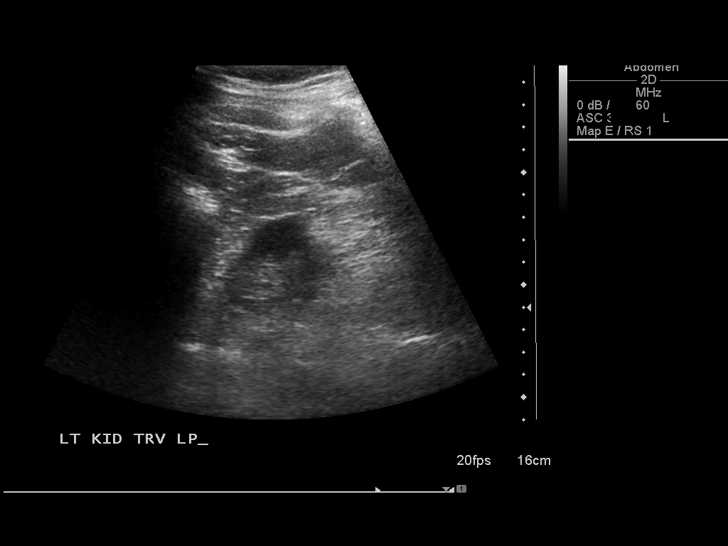
[im 26/29]
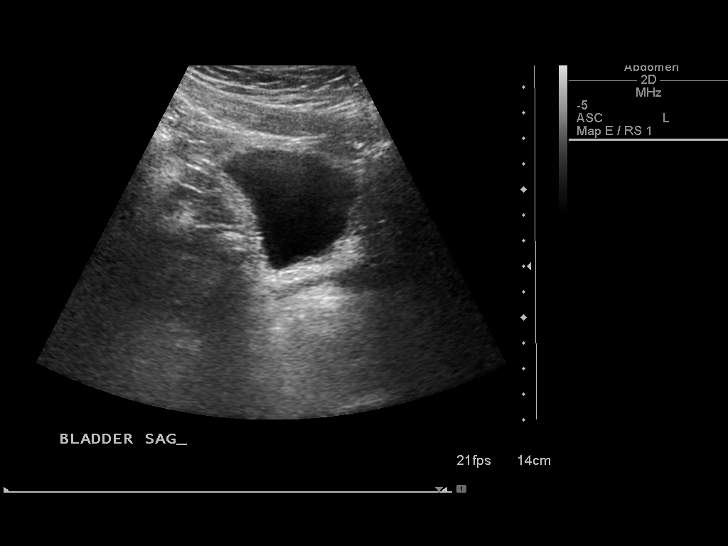
[im 29/29]
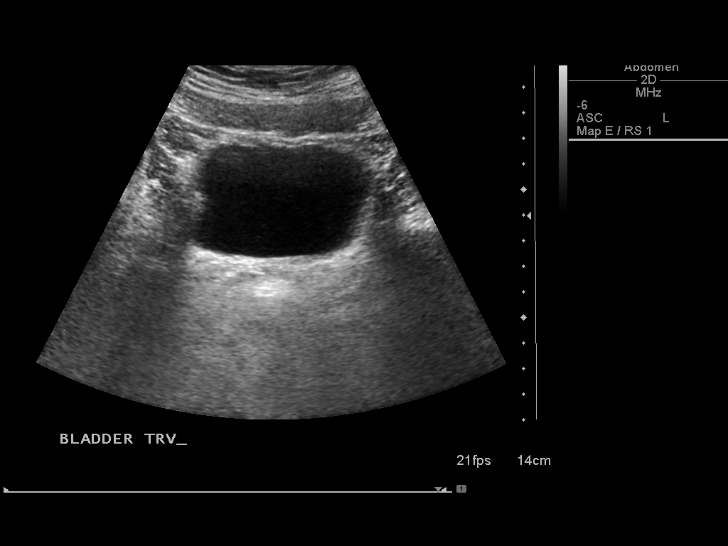

[14 of 25 positions shown; findings below may reference images not displayed]

FINDINGS: Right Kidney:  Measures 11.2 cm.  Normal in size and parenchymal
echogenicity.  No evidence of mass or hydronephrosis. Septated cyst
within the mid pole measures 1.6 cm.  No nodularity identified.

Left Kidney:  Measures 11.7 cm.  Normal in size and parenchymal
echogenicity.  No evidence of mass or hydronephrosis.

Bladder:  Appears normal for degree of bladder distention.
IMPRESSION: 1.  Septated cyst within the mid pole of the right kidney.  This is
indeterminate.  Advise further evaluation with cross-sectional
imaging.  The study of choice would be a contrast-enhanced MRI.  If
the patient cannot have an MRI then the second best study would be
a pre and post contrast CT of the kidneys.

## 2015-02-03 ENCOUNTER — Emergency Department (HOSPITAL_COMMUNITY)
Admission: EM | Admit: 2015-02-03 | Discharge: 2015-02-03 | Disposition: A | Discharge status: Home / Self Care | Payer: 59 | Attending: Emergency Medicine | Admitting: Emergency Medicine

## 2015-02-03 ENCOUNTER — Encounter (HOSPITAL_COMMUNITY): Payer: Self-pay | Admitting: Emergency Medicine

## 2015-02-03 DIAGNOSIS — Z8739 Personal history of other diseases of the musculoskeletal system and connective tissue: Secondary | ICD-10-CM | POA: Insufficient documentation

## 2015-02-03 DIAGNOSIS — R140 Abdominal distension (gaseous): Secondary | ICD-10-CM | POA: Insufficient documentation

## 2015-02-03 DIAGNOSIS — Z7982 Long term (current) use of aspirin: Secondary | ICD-10-CM | POA: Insufficient documentation

## 2015-02-03 DIAGNOSIS — Z87820 Personal history of traumatic brain injury: Secondary | ICD-10-CM | POA: Diagnosis not present

## 2015-02-03 DIAGNOSIS — Z79899 Other long term (current) drug therapy: Secondary | ICD-10-CM | POA: Diagnosis not present

## 2015-02-03 DIAGNOSIS — R339 Retention of urine, unspecified: Secondary | ICD-10-CM | POA: Diagnosis not present

## 2015-02-03 DIAGNOSIS — Z87891 Personal history of nicotine dependence: Secondary | ICD-10-CM | POA: Diagnosis not present

## 2015-02-03 DIAGNOSIS — R319 Hematuria, unspecified: Secondary | ICD-10-CM | POA: Diagnosis not present

## 2015-02-03 DIAGNOSIS — Z8719 Personal history of other diseases of the digestive system: Secondary | ICD-10-CM | POA: Diagnosis not present

## 2015-02-03 DIAGNOSIS — Z8551 Personal history of malignant neoplasm of bladder: Secondary | ICD-10-CM | POA: Diagnosis not present

## 2015-02-03 LAB — URINALYSIS, ROUTINE W REFLEX MICROSCOPIC
Bilirubin Urine: NEGATIVE
Glucose, UA: NEGATIVE mg/dL
KETONES UR: NEGATIVE mg/dL
NITRITE: NEGATIVE
PH: 6.5 (ref 5.0–8.0)
Protein, ur: >300 mg/dL — AB
SPECIFIC GRAVITY, URINE: 1.02 (ref 1.005–1.030)
Urobilinogen, UA: 0.2 mg/dL (ref 0.0–1.0)

## 2015-02-03 LAB — I-STAT CHEM 8, ED
BUN: 20 mg/dL (ref 6–20)
CALCIUM ION: 1.09 mmol/L — AB (ref 1.12–1.23)
CHLORIDE: 103 mmol/L (ref 101–111)
Creatinine, Ser: 0.8 mg/dL (ref 0.61–1.24)
Glucose, Bld: 117 mg/dL — ABNORMAL HIGH (ref 65–99)
HEMATOCRIT: 46 % (ref 39.0–52.0)
Hemoglobin: 15.6 g/dL (ref 13.0–17.0)
POTASSIUM: 3.8 mmol/L (ref 3.5–5.1)
SODIUM: 138 mmol/L (ref 135–145)
TCO2: 20 mmol/L (ref 0–100)

## 2015-02-03 LAB — URINE MICROSCOPIC-ADD ON

## 2015-02-03 LAB — CBC
HCT: 43.4 % (ref 39.0–52.0)
HEMOGLOBIN: 14.6 g/dL (ref 13.0–17.0)
MCH: 31.3 pg (ref 26.0–34.0)
MCHC: 33.6 g/dL (ref 30.0–36.0)
MCV: 93.1 fL (ref 78.0–100.0)
Platelets: 153 10*3/uL (ref 150–400)
RBC: 4.66 MIL/uL (ref 4.22–5.81)
RDW: 12.4 % (ref 11.5–15.5)
WBC: 7.7 10*3/uL (ref 4.0–10.5)

## 2015-02-03 MED ORDER — LIDOCAINE HCL 2 % EX GEL
CUTANEOUS | Status: AC
  Filled 2015-02-03: qty 10

## 2015-02-03 MED ORDER — KETOROLAC TROMETHAMINE 60 MG/2ML IM SOLN
60.0000 mg | Freq: Once | INTRAMUSCULAR | Status: AC
  Administered 2015-02-03: 60 mg via INTRAMUSCULAR
  Filled 2015-02-03: qty 2

## 2015-02-03 MED ORDER — TAMSULOSIN HCL 0.4 MG PO CAPS: 0.4000 mg | ORAL_CAPSULE | Freq: Every day | ORAL | Status: DC

## 2015-02-03 MED ORDER — HYDROCODONE-ACETAMINOPHEN 5-325 MG PO TABS
1.0000 | ORAL_TABLET | Freq: Once | ORAL | Status: AC
  Administered 2015-02-03: 1 via ORAL
  Filled 2015-02-03: qty 1

## 2015-02-03 MED ORDER — TAMSULOSIN HCL 0.4 MG PO CAPS
0.4000 mg | ORAL_CAPSULE | Freq: Every day | ORAL | Status: DC
  Administered 2015-02-03: 0.4 mg via ORAL
  Filled 2015-02-03: qty 1

## 2015-02-03 NOTE — ED Notes (Signed)
This Probation officer explained discharge instructions to pt and wife,  Pt would like to speak with MD,  He doesn't feel he can handle pain and cath at home

## 2015-02-03 NOTE — ED Provider Notes (Addendum)
CSN: [id number]     Arrival date & time 02/03/15  0441 History   First MD Initiated Contact with Patient 02/03/15 0503     Chief Complaint  Patient presents with  . Urinary Retention  . Hematuria     (Consider location/radiation/quality/duration/timing/severity/associated sxs/prior Treatment) HPI Comments: Patient with a history of bladder resections polyps, last procedure as of 2013, was scoped in July.  He had no recurrent polyps.  Please head exam hematuria since then.  Tonight he is passing clots and feels like he cannot urinate.  Denies any fever, trauma, a  Patient is a 54 y.o. male presenting with hematuria. The history is provided by the patient.  Hematuria This is a recurrent problem. The current episode started yesterday. The problem occurs constantly. The problem has been gradually worsening. Associated symptoms include urinary symptoms. Pertinent negatives include no abdominal pain, chills or fever. Nothing aggravates the symptoms. He has tried nothing for the symptoms.    Past Medical History  Diagnosis Date  . Bladder tumor     w/ hematuria  . Lumbar herniated disc     x2 disc-- occ. pain-- goes to chiapractor  . Reflux occasional    watches diet  . History of concussion 1970'S WITH LOC-- NO RESIDUAL  . Bladder cancer S/P TURBT, MITOMYCIN INSTILLATION AND LAST BCG TX    Past Surgical History  Procedure Laterality Date  . Inguinal hernia repair  INFANT    BILATERAL  . Pilonidal cyst excision  1978  . Transurethral resection of bladder tumor  05/02/2011    Procedure: CYSTOSCOPY/ RETROGRADE PYELOGRAM / LEFT URTEROSCOPY/ TRANSURETHRAL RESECTION OF BLADDER TUMOR (TURBT);  Surgeon: Hanley Ben, MD;  Location[facility];  Service: Urology;  Laterality: N/A; MYTOMYCIN C  . Transurethral resection of bladder tumor  08/08/2011    Procedure: TRANSURETHRAL RESECTION OF BLADDER TUMOR (TURBT);  Surgeon: Hanley Ben, MD;  Location: [facility];  Service: Urology;  Laterality: N/A;  TUR BLADDER BX  . Transurethral resection of bladder tumor  11/28/2011    Procedure: TRANSURETHRAL RESECTION OF BLADDER TUMOR (TURBT);  Surgeon: Hanley Ben, MD;  Location: [facility];  Service: Urology;  Laterality: N/A;  1 HR    . Cystoscopy  11/28/2011    Procedure: CYSTOSCOPY;  Surgeon: Hanley Ben, MD;  Location: [facility];  Service: Urology;  Laterality: N/A;   No family history on file. Social History  Substance Use Topics  . Smoking status: Former Smoker -- 1.00 packs/day for 18 years    Types: Cigarettes    Quit date: 04/30/2009  . Smokeless tobacco: Never Used  . Alcohol Use: 0.0 oz/week     Comment: OCCASIONAL    Review of Systems  Constitutional: Negative for fever and chills.  Gastrointestinal: Positive for abdominal distention. Negative for abdominal pain.  Genitourinary: Positive for hematuria and difficulty urinating. Negative for urgency and testicular pain.  All other systems reviewed and are negative.     Allergies  Review of patient's allergies indicates no known allergies.  Home Medications   Prior to Admission medications   Medication Sig Start Date End Date Taking? Authorizing Provider  aspirin 81 MG EC tablet Take 81 mg by mouth daily.   Yes Historical Provider, MD  metoprolol succinate (TOPROL-XL) 50 MG 24 hr tablet Take 1 tablet by mouth daily. 06/26/14  Yes Historical Provider, MD  rosuvastatin (CRESTOR) 20 MG tablet Take 1 tablet by mouth daily. 09/28/14  Yes Historical Provider, MD  BP 139/90 mmHg  Pulse 65  Temp(Src) 98.6 F (37 C) (Oral)  Resp 18  SpO2 98% Physical Exam  Constitutional: He is oriented to person, place, and time. He appears well-developed and well-nourished.  HENT:  Head: Normocephalic.  Eyes: Pupils are equal, round, and reactive to light.  Neck: Normal range of motion.  Cardiovascular: Normal rate.   Pulmonary/Chest: Effort normal.   Abdominal: He exhibits distension.  Over 800 cc urine in bladder   Musculoskeletal: Normal range of motion.  Neurological: He is alert and oriented to person, place, and time.  Skin: Skin is warm.  Nursing note and vitals reviewed.   ED Course  Procedures (including critical care time) Labs Review Labs Reviewed  I-STAT CHEM 8, ED - Abnormal; Notable for the following:    Glucose, Bld 117 (*)    Calcium, Ion 1.09 (*)    All other components within normal limits  CBC  URINALYSIS, ROUTINE W REFLEX MICROSCOPIC (NOT AT [facility])    Imaging Review No results found. I have personally reviewed and evaluated these images and lab results as part of my medical decision-making.   EKG Interpretation None     Will place foley catheter irrigate check cbc and kidney function  MDM   Final diagnoses:  Urinary retention  Hematuria         Junius Creamer, NP 02/03/15 0513  April Palumbo, MD 02/03/15 0515  Junius Creamer, NP 02/03/15 1610  Veatrice Kells, MD 02/03/15 9604  Veatrice Kells, MD 02/03/15 [id number]

## 2015-02-03 NOTE — Discharge Instructions (Signed)
Hematuria Hematuria is blood in your urine. It can be caused by a bladder infection, kidney infection, prostate infection, kidney stone, or cancer of your urinary tract. Infections can usually be treated with medicine, and a kidney stone usually will pass through your urine. If neither of these is the cause of your hematuria, further workup to find out the reason may be needed. It is very important that you tell your health care provider about any blood you see in your urine, even if the blood stops without treatment or happens without causing pain. Blood in your urine that happens and then stops and then happens again can be a symptom of a very serious condition. Also, pain is not a symptom in the initial stages of many urinary cancers. HOME CARE INSTRUCTIONS   Drink lots of fluid, 3-4 quarts a day. If you have been diagnosed with an infection, cranberry juice is especially recommended, in addition to large amounts of water.  Avoid caffeine, tea, and carbonated beverages because they tend to irritate the bladder.  Avoid alcohol because it may irritate the prostate.  Take all medicines as directed by your health care provider.  If you were prescribed an antibiotic medicine, finish it all even if you start to feel better.  If you have been diagnosed with a kidney stone, follow your health care provider's instructions regarding straining your urine to catch the stone.  Empty your bladder often. Avoid holding urine for long periods of time.  After a bowel movement, women should cleanse front to back. Use each tissue only once.  Empty your bladder before and after sexual intercourse if you are a male. SEEK MEDICAL CARE IF:  You develop back pain.  You have a fever.  You have a feeling of sickness in your stomach (nausea) or vomiting.  Your symptoms are not better in 3 days. Return sooner if you are getting worse. SEEK IMMEDIATE MEDICAL CARE IF:   You develop severe vomiting and are  unable to keep the medicine down.  You develop severe back or abdominal pain despite taking your medicines.  You begin passing a large amount of blood or clots in your urine.  You feel extremely weak or faint, or you pass out. MAKE SURE YOU:   Understand these instructions.  Will watch your condition.  Will get help right away if you are not doing well or get worse. Document Released: 06/02/2005 Document Revised: 10/17/2013 Document Reviewed: 01/31/2013 [facility] Patient Information 2015 [place], [place]. This information is not intended to replace advice given to you by your health care provider. Make sure you discuss any questions you have with your health care provider. Today, who presented to the emergency room and for inability to urinate.  A catheter was placed easily.  You do have quite a bit of blood in your urine.  Your hemoglobin and hematocrit have been checked.  They are well within normal parameters your electrolytes, BUN and creatinine are normal.  Please call Dr. Janice Norrie to set an appointment time to be seen in the office

## 2015-02-03 NOTE — ED Notes (Signed)
This Probation officer inserted foley cath per Baker Janus NP,  22 french due to blood clots,  Pt tolerated well,  He does have abdominal pain,  Intervention given,  Also used lidocaine urojet prior to cath insertion

## 2015-02-03 NOTE — ED Notes (Signed)
Pt from home c/o urinary retention since 0200. HE reports blood an blood clots present. Last time he emptied his bladder was 2230. Hx of bladder CA.

## 2015-02-03 NOTE — ED Notes (Signed)
Placed leg bag on patient

## 2015-02-04 LAB — URINE CULTURE
CULTURE: NO GROWTH
SPECIAL REQUESTS: NORMAL

## 2015-03-08 ENCOUNTER — Other Ambulatory Visit: Payer: Self-pay | Admitting: Urology

## 2015-03-29 ENCOUNTER — Encounter (HOSPITAL_BASED_OUTPATIENT_CLINIC_OR_DEPARTMENT_OTHER): Payer: Self-pay | Admitting: *Deleted

## 2015-03-29 NOTE — Progress Notes (Signed)
NPO AFTER MN.  ARRIVE AT 0600.  NEEDS ISTAT AND EKG.  WILL TAKE TOPROL AM DOS W/ SIP OF WATER AND IF NEEDED TAKE HYDROCODONE/ FLEXERIL.

## 2015-04-04 NOTE — H&P (Signed)
Urology History and Physical Exam  CC:   History of bladder cancerHPI:  54 year old mapresents for cystoscopy, bladder biopsy, bilateral retrograde ureteropyelogram. His history is outlined below:  He initially presented to our practice in late 2012 microscopic hematuria. Evaluation revealed bladder cancer. He underwent TURBT on 05/02/2011. The lesion was located in the left bladder wall lateral to the left ureteral orifice. I cannot tell if he received mitomycin during his original resection. 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 The lesion was measured at approximately 4 cm in size.  He underwent induction BCG which was completed on 07/25/2011. Repeat biopsy was performed on 08/08/2011. All biopsies were negative, cytology of bladder washings was negative. He underwent repeat TURBT on 11/28/2011. A small lesion revealed high-grade papillary urothelial carcinoma, again without evidence of invasion. He completed a second induction BCG course on 02/10/2012. He completed a maintenance BCG, last given in July, 2015. Hematuria protocol CT scan in January/2016 was negative. At his last visit here in July 2016, cystoscopy and cytology were negative. The patient had significant gross hematuria in late August, 2016. That required catheter drainage, irrigation, and coming off of his aspirin. He is now on 81 mg of aspirin every other day. He says that for the past 6 months or so, he has had a small amount of initial hematuria. He notices it more if he does heavy work. He denies total gross hematuria. He denies lateralizing flank pain.    PMH: Past Medical History  Diagnosis Date  . Lumbar herniated disc     x2 disc-- occ. pain-- goes to chiapractor  . Reflux occasional    watches diet  . History of concussion     1970's w/ LOC---  no residual  . Hematuria   . History of bladder cancer urologist-  dr Diona Fanti    2012  &  2013 x2  s/p TURBT/   instillation mitomycin c and bcg tx's  . Bilateral renal cysts   . History of prostate cancer     T1c  ,  Gleason 4+3---  05-02-2011  Radioactive prostate seed implants  . History of urinary retention     02-03-2015  . 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 Coronary artery disease   . History of MI (myocardial infarction)     2014  . Hyperlipidemia   . Dysuria   . Urgency of urination   . Wears glasses     PSH: Past Surgical History  Procedure Laterality Date  . Pilonidal cyst excision  1978  . Transurethral resection of bladder tumor  05/02/2011    Procedure: CYSTOSCOPY/ RETROGRADE PYELOGRAM / LEFT URTEROSCOPY/ TRANSURETHRAL RESECTION OF BLADDER TUMOR (TURBT);  Surgeon: Hanley Ben, MD;  Location: [facility];  Service: Urology;  Laterality: N/A; MYTOMYCIN C  . Transurethral resection of bladder tumor  08/08/2011    Procedure: TRANSURETHRAL RESECTION OF BLADDER TUMOR (TURBT);  Surgeon: Hanley Ben, MD;  Location: [facility];  Service: Urology;  Laterality: N/A;  TUR BLADDER BX  . Transurethral resection of bladder tumor  11/28/2011    Procedure: TRANSURETHRAL RESECTION OF BLADDER TUMOR (TURBT);  Surgeon: Hanley Ben, MD;  Location: [facility];  Service: Urology;  Laterality: N/A;  . Cystoscopy  11/28/2011    Procedure: CYSTOSCOPY;  Surgeon: Hanley Ben, MD;  Location: [facility];  Service: Urology;  Laterality: N/A;  . Inguinal hernia repair Bilateral INFANT  . Radioactive  prostate seed implants  05-02-2011  . Coronary artery bypass graft  Nov 2014    LIMA to LAD/  SVG to OM/  SVG to RCA  . Achilles tendon repair Right May 2015    Allergies: No Known Allergies  Medications: No prescriptions prior to admission     Social History: Social History   Social History  . Marital Status: Married    Spouse Name: N/A  . Number of Children: N/A  . Years of Education: N/A   Occupational History  . Not on file.    Social History Main Topics  . Smoking status: Former Smoker -- 1.00 packs/day for 18 years    Types: Cigarettes    Quit date: 04/30/2009  . Smokeless tobacco: Never Used  . Alcohol Use: 0.0 oz/week     Comment: OCCASIONAL  . Drug Use: No  . Sexual Activity: Not on file   Other Topics Concern  . Not on file   Social History Narrative    Family History: History reviewed. No pertinent family history.  Review of Systems: Genitourinary, constitutional, skin, eye, otolaryngeal, hematologic/lymphatic, cardiovascular, pulmonary, endocrine, musculoskeletal, gastrointestinal, neurological and psychiatric system(s) were reviewed and pertinent findings if present are noted and are otherwise negative.  Genitourinary: hematuria.  Musculoskeletal: back pain.              Physical Exam: @VITALS2 @ General: No acute distress.  Awake. Head:  Normocephalic.  Atraumatic. ENT:  EOMI.  Mucous membranes moist Neck:  Supple.  No lymphadenopathy. CV:  S1 present. S2 present. Regular rate. Pulmonary: Equal effort bilaterally.  Clear to auscultation bilaterally. Abdomen: Soft.   Non- tender to palpation. Skin:  Normal turgor.  No visible rash. Extremity: No gross deformity of bilateral upper extremities.  No gross deformity of                             lower extremities. Neurologic: Alert. Appropriate mood.   Studies:  No results for input(s): HGB, WBC, PLT in the last 72 hours.  No results for input(s): NA, K, CL, CO2, BUN, CREATININE, CALCIUM, GFRNONAA, GFRAA in the last 72 hours.  Invalid input(s): MAGNESIUM   No results for input(s): INR, APTT in the last 72 hours.  Invalid input(s): PT   Invalid input(s): ABG    Assessment:  History of non-muscle invasive/high-grade bladder cancer, status post initial resection in 2013. He had a small recurrence in 2013. He had appropriate postoperative management with BCG. Recent gross hematuria, initially nature, but suspicious in this man  with intermediate risk bladder cancer  Plan:  cystoscopy, bladder/prostatic urethral biopsies, possible TURBT, bilateral retrograde ureteropyelogram.

## 2015-04-05 ENCOUNTER — Encounter (HOSPITAL_BASED_OUTPATIENT_CLINIC_OR_DEPARTMENT_OTHER): Payer: Self-pay

## 2015-04-05 ENCOUNTER — Encounter (HOSPITAL_BASED_OUTPATIENT_CLINIC_OR_DEPARTMENT_OTHER)
Admission: RE | Disposition: A | Discharge status: Home / Self Care | Payer: Self-pay | Source: Ambulatory Visit | Attending: Urology

## 2015-04-05 ENCOUNTER — Ambulatory Visit (HOSPITAL_BASED_OUTPATIENT_CLINIC_OR_DEPARTMENT_OTHER): Payer: 59 | Admitting: Certified Registered"

## 2015-04-05 ENCOUNTER — Ambulatory Visit (HOSPITAL_BASED_OUTPATIENT_CLINIC_OR_DEPARTMENT_OTHER)
Admission: RE | Admit: 2015-04-05 | Discharge: 2015-04-05 | Disposition: A | Discharge status: Home / Self Care | Payer: 59 | Source: Ambulatory Visit | Attending: Urology | Admitting: Urology

## 2015-04-05 ENCOUNTER — Other Ambulatory Visit: Payer: Self-pay

## 2015-04-05 DIAGNOSIS — I2510 Atherosclerotic heart disease of native coronary artery without angina pectoris: Secondary | ICD-10-CM | POA: Insufficient documentation

## 2015-04-05 DIAGNOSIS — Z8550 Personal history of malignant neoplasm of unspecified urinary tract organ: Secondary | ICD-10-CM | POA: Insufficient documentation

## 2015-04-05 DIAGNOSIS — I252 Old myocardial infarction: Secondary | ICD-10-CM | POA: Diagnosis not present

## 2015-04-05 DIAGNOSIS — K219 Gastro-esophageal reflux disease without esophagitis: Secondary | ICD-10-CM | POA: Insufficient documentation

## 2015-04-05 DIAGNOSIS — E785 Hyperlipidemia, unspecified: Secondary | ICD-10-CM | POA: Diagnosis not present

## 2015-04-05 DIAGNOSIS — Z951 Presence of aortocoronary bypass graft: Secondary | ICD-10-CM | POA: Insufficient documentation

## 2015-04-05 DIAGNOSIS — Z87891 Personal history of nicotine dependence: Secondary | ICD-10-CM | POA: Insufficient documentation

## 2015-04-05 DIAGNOSIS — C679 Malignant neoplasm of bladder, unspecified: Secondary | ICD-10-CM

## 2015-04-05 DIAGNOSIS — Z8546 Personal history of malignant neoplasm of prostate: Secondary | ICD-10-CM | POA: Diagnosis not present

## 2015-04-05 DIAGNOSIS — N3021 Other chronic cystitis with hematuria: Secondary | ICD-10-CM | POA: Insufficient documentation

## 2015-04-05 DIAGNOSIS — I10 Essential (primary) hypertension: Secondary | ICD-10-CM | POA: Insufficient documentation

## 2015-04-05 DIAGNOSIS — R319 Hematuria, unspecified: Secondary | ICD-10-CM | POA: Diagnosis present

## 2015-04-05 HISTORY — DX: Presence of aortocoronary bypass graft: Z95.1

## 2015-04-05 HISTORY — DX: Old myocardial infarction: I25.2

## 2015-04-05 HISTORY — DX: Personal history of malignant neoplasm of prostate: Z85.46

## 2015-04-05 HISTORY — PX: CYSTOSCOPY W/ RETROGRADES: SHX1426

## 2015-04-05 HISTORY — DX: Cyst of kidney, acquired: N28.1

## 2015-04-05 HISTORY — DX: Presence of spectacles and contact lenses: Z97.3

## 2015-04-05 HISTORY — DX: Atherosclerotic heart disease of native coronary artery without angina pectoris: I25.10

## 2015-04-05 HISTORY — DX: Hematuria, unspecified: R31.9

## 2015-04-05 HISTORY — DX: Personal history of malignant neoplasm of bladder: Z85.51

## 2015-04-05 HISTORY — DX: Personal history of other specified conditions: Z87.898

## 2015-04-05 HISTORY — DX: Hyperlipidemia, unspecified: E78.5

## 2015-04-05 LAB — POCT I-STAT 4, (NA,K, GLUC, HGB,HCT)
GLUCOSE: 123 mg/dL — AB (ref 65–99)
HEMATOCRIT: 45 % (ref 39.0–52.0)
Hemoglobin: 15.3 g/dL (ref 13.0–17.0)
POTASSIUM: 3.8 mmol/L (ref 3.5–5.1)
Sodium: 141 mmol/L (ref 135–145)

## 2015-04-05 SURGERY — CYSTOSCOPY, WITH RETROGRADE PYELOGRAM
Anesthesia: General | Laterality: Bilateral

## 2015-04-05 MED ORDER — HYDROMORPHONE HCL 1 MG/ML IJ SOLN
0.2500 mg | INTRAMUSCULAR | Status: DC | PRN
  Filled 2015-04-05: qty 1

## 2015-04-05 MED ORDER — KETOROLAC TROMETHAMINE 30 MG/ML IJ SOLN
INTRAMUSCULAR | Status: DC | PRN
  Administered 2015-04-05: 30 mg via INTRAVENOUS

## 2015-04-05 MED ORDER — HYDROCODONE-ACETAMINOPHEN 5-325 MG PO TABS: 1.0000 | ORAL_TABLET | ORAL | Status: DC | PRN

## 2015-04-05 MED ORDER — SODIUM CHLORIDE 0.9 % IR SOLN
Status: DC | PRN
  Administered 2015-04-05: 500 mL

## 2015-04-05 MED ORDER — CEFAZOLIN SODIUM-DEXTROSE 2-3 GM-% IV SOLR
2.0000 g | INTRAVENOUS | Status: AC
  Administered 2015-04-05: 2 g via INTRAVENOUS
  Filled 2015-04-05: qty 50

## 2015-04-05 MED ORDER — MIDAZOLAM HCL 2 MG/2ML IJ SOLN
INTRAMUSCULAR | Status: AC
  Filled 2015-04-05: qty 2

## 2015-04-05 MED ORDER — HYDROCODONE-ACETAMINOPHEN 5-325 MG PO TABS
ORAL_TABLET | ORAL | Status: AC
  Filled 2015-04-05: qty 1

## 2015-04-05 MED ORDER — ONDANSETRON HCL 4 MG/2ML IJ SOLN
INTRAMUSCULAR | Status: DC | PRN
  Administered 2015-04-05: 4 mg via INTRAVENOUS

## 2015-04-05 MED ORDER — IOHEXOL 350 MG/ML SOLN
INTRAVENOUS | Status: DC | PRN
Start: 2015-04-05 — End: 2015-04-05
  Administered 2015-04-05: 10 mL via URETHRAL

## 2015-04-05 MED ORDER — DEXAMETHASONE SODIUM PHOSPHATE 4 MG/ML IJ SOLN
INTRAMUSCULAR | Status: DC | PRN
  Administered 2015-04-05: 10 mg via INTRAVENOUS

## 2015-04-05 MED ORDER — URELLE 81 MG PO TABS
ORAL_TABLET | ORAL | Status: AC
  Filled 2015-04-05: qty 1

## 2015-04-05 MED ORDER — SULFAMETHOXAZOLE-TRIMETHOPRIM 800-160 MG PO TABS: 1.0000 | ORAL_TABLET | Freq: Two times a day (BID) | ORAL | Status: DC

## 2015-04-05 MED ORDER — MIDAZOLAM HCL 5 MG/5ML IJ SOLN
INTRAMUSCULAR | Status: DC | PRN
  Administered 2015-04-05: 2 mg via INTRAVENOUS

## 2015-04-05 MED ORDER — URIBEL 118 MG PO CAPS: 1.0000 | ORAL_CAPSULE | Freq: Three times a day (TID) | ORAL | Status: DC | PRN

## 2015-04-05 MED ORDER — STERILE WATER FOR IRRIGATION IR SOLN
Status: DC | PRN
  Administered 2015-04-05: 6000 mL

## 2015-04-05 MED ORDER — LACTATED RINGERS IV SOLN
INTRAVENOUS | Status: DC
  Administered 2015-04-05: 07:00:00 via INTRAVENOUS
  Filled 2015-04-05: qty 1000

## 2015-04-05 MED ORDER — CEFAZOLIN SODIUM 1-5 GM-% IV SOLN
1.0000 g | INTRAVENOUS | Status: DC
  Filled 2015-04-05: qty 50

## 2015-04-05 MED ORDER — MEPERIDINE HCL 25 MG/ML IJ SOLN
6.2500 mg | INTRAMUSCULAR | Status: DC | PRN
  Filled 2015-04-05: qty 1

## 2015-04-05 MED ORDER — PROMETHAZINE HCL 25 MG/ML IJ SOLN
6.2500 mg | INTRAMUSCULAR | Status: DC | PRN
  Filled 2015-04-05: qty 1

## 2015-04-05 MED ORDER — CEFAZOLIN SODIUM-DEXTROSE 2-3 GM-% IV SOLR
INTRAVENOUS | Status: AC
  Filled 2015-04-05: qty 50

## 2015-04-05 MED ORDER — URELLE 81 MG PO TABS
1.0000 | ORAL_TABLET | Freq: Four times a day (QID) | ORAL | Status: DC
  Administered 2015-04-05: 81 mg via ORAL
  Filled 2015-04-05: qty 1

## 2015-04-05 MED ORDER — LIDOCAINE HCL (CARDIAC) 20 MG/ML IV SOLN
INTRAVENOUS | Status: DC | PRN
  Administered 2015-04-05: 60 mg via INTRAVENOUS

## 2015-04-05 MED ORDER — FENTANYL CITRATE (PF) 100 MCG/2ML IJ SOLN
INTRAMUSCULAR | Status: DC | PRN
  Administered 2015-04-05 (×4): 25 ug via INTRAVENOUS
  Administered 2015-04-05: 50 ug via INTRAVENOUS

## 2015-04-05 MED ORDER — FENTANYL CITRATE (PF) 100 MCG/2ML IJ SOLN
INTRAMUSCULAR | Status: AC
  Filled 2015-04-05: qty 6

## 2015-04-05 MED ORDER — PROPOFOL 10 MG/ML IV BOLUS
INTRAVENOUS | Status: DC | PRN
  Administered 2015-04-05: 180 mg via INTRAVENOUS

## 2015-04-05 MED ORDER — HYDROCODONE-ACETAMINOPHEN 5-325 MG PO TABS
1.0000 | ORAL_TABLET | Freq: Four times a day (QID) | ORAL | Status: DC | PRN
  Administered 2015-04-05: 1 via ORAL
  Filled 2015-04-05: qty 1

## 2015-04-05 SURGICAL SUPPLY — 27 items
ADAPTER CATH URET PLST 4-6FR (CATHETERS) IMPLANT
BAG DRAIN URO-CYSTO SKYTR STRL (DRAIN) ×3 IMPLANT
BAG URINE DRAINAGE (UROLOGICAL SUPPLIES) ×3 IMPLANT
CANISTER SUCT LVC 12 LTR MEDI- (MISCELLANEOUS) IMPLANT
CATH FOLEY 2WAY SLVR  5CC 20FR (CATHETERS) ×2
CATH FOLEY 2WAY SLVR 5CC 20FR (CATHETERS) ×1 IMPLANT
CATH INTERMIT  6FR 70CM (CATHETERS) ×6 IMPLANT
CATH URET 5FR 28IN CONE TIP (BALLOONS)
CATH URET 5FR 28IN OPEN ENDED (CATHETERS) IMPLANT
CATH URET 5FR 70CM CONE TIP (BALLOONS) IMPLANT
CLOTH BEACON ORANGE TIMEOUT ST (SAFETY) ×3 IMPLANT
CONT SPEC 4OZ CLIKSEAL STRL BL (MISCELLANEOUS) ×6 IMPLANT
GLOVE BIO SURGEON STRL SZ8 (GLOVE) ×3 IMPLANT
GOWN STRL REUS W/ TWL LRG LVL3 (GOWN DISPOSABLE) ×1 IMPLANT
GOWN STRL REUS W/ TWL XL LVL3 (GOWN DISPOSABLE) ×1 IMPLANT
GOWN STRL REUS W/TWL LRG LVL3 (GOWN DISPOSABLE) ×2
GOWN STRL REUS W/TWL XL LVL3 (GOWN DISPOSABLE) ×2
GUIDEWIRE 0.038 PTFE COATED (WIRE) IMPLANT
GUIDEWIRE ANG ZIPWIRE 038X150 (WIRE) IMPLANT
GUIDEWIRE STR DUAL SENSOR (WIRE) ×3 IMPLANT
KIT ROOM TURNOVER WOR (KITS) ×3 IMPLANT
MANIFOLD NEPTUNE II (INSTRUMENTS) ×3 IMPLANT
NS IRRIG 500ML POUR BTL (IV SOLUTION) IMPLANT
PACK CYSTO (CUSTOM PROCEDURE TRAY) ×3 IMPLANT
SYR CONTROL 10ML LL (SYRINGE) ×3 IMPLANT
SYRINGE IRR TOOMEY STRL 70CC (SYRINGE) ×3 IMPLANT
WATER STERILE IRR 3000ML UROMA (IV SOLUTION) ×3 IMPLANT

## 2015-04-05 NOTE — Anesthesia Procedure Notes (Signed)
Procedure Name: LMA Insertion Date/Time: 04/05/2015 7:45 AM Performed by: Denna Haggard D Pre-anesthesia Checklist: Patient identified, Emergency Drugs available, Suction available and Patient being monitored Patient Re-evaluated:Patient Re-evaluated prior to inductionOxygen Delivery Method: Circle System Utilized Preoxygenation: Pre-oxygenation with 100% oxygen Intubation Type: IV induction Ventilation: Mask ventilation without difficulty LMA: LMA inserted LMA Size: 4.0 Number of attempts: 1 Airway Equipment and Method: Bite block Placement Confirmation: positive ETCO2 Tube secured with: Tape Dental Injury: Teeth and Oropharynx as per pre-operative assessment

## 2015-04-05 NOTE — Anesthesia Preprocedure Evaluation (Signed)
Anesthesia Evaluation  Patient identified by MRN, date of birth, ID band Patient awake    Reviewed: Allergy & Precautions, H&P , NPO status , Patient's Chart, lab work & pertinent test results, reviewed documented beta blocker date and time   Airway Mallampati: II  TM Distance: >3 FB Neck ROM: Full    Dental  (+) Dental Advisory Given   Pulmonary former smoker,    breath sounds clear to auscultation       Cardiovascular hypertension, Pt. on home beta blockers + CAD, + Past MI and + CABG   Rhythm:Regular Rate:Normal     Neuro/Psych negative neurological ROS  negative psych ROS   GI/Hepatic negative GI ROS, Neg liver ROS,   Endo/Other  negative endocrine ROS  Renal/GU Renal disease   Bladder lesion    Musculoskeletal negative musculoskeletal ROS (+)   Abdominal   Peds  Hematology negative hematology ROS (+)   Anesthesia Other Findings Upper front bridge  Reproductive/Obstetrics                             Anesthesia Physical  Anesthesia Plan  ASA: III  Anesthesia Plan: General   Post-op Pain Management:    Induction: Intravenous  Airway Management Planned: LMA  Additional Equipment:   Intra-op Plan:   Post-operative Plan: Extubation in OR  Informed Consent: I have reviewed the patients History and Physical, chart, labs and discussed the procedure including the risks, benefits and alternatives for the proposed anesthesia with the patient or authorized representative who has indicated his/her understanding and acceptance.   Dental advisory given  Plan Discussed with: CRNA and Surgeon  Anesthesia Plan Comments:         Anesthesia Quick Evaluation

## 2015-04-05 NOTE — Transfer of Care (Signed)
Immediate Anesthesia Transfer of Care Note  Patient: Richard Brewer  Procedure(s) Performed: Procedure(s) (LRB): CYSTOSCOPY WITH BILATERAL RETROGRADE PYELOGRAM, BLADDER BIOPSY (Bilateral)  Patient Location: PACU  Anesthesia Type: General  Level of Consciousness: awake, oriented, sedated and patient cooperative  Airway & Oxygen Therapy: Patient Spontanous Breathing and Patient connected to face mask oxygen  Post-op Assessment: Report given to PACU RN and Post -op Vital signs reviewed and stable  Post vital signs: Reviewed and stable  Complications: No apparent anesthesia complications

## 2015-04-05 NOTE — Op Note (Signed)
Preoperative diagnosis: History of bladder cancer with atypical cytology and recurrent hematuria  Postoperative diagnosis: Same  Procedure: Cystoscopy, random bladder biopsies, bladder barbotage, bilateral renal washings, bilateral retrograde ureteropyelograms with interpretive fluoroscopy   Surgeon: Lillette Boxer. Dealie Koelzer, M.D.   Anesthesia: Gen.   Complications: None  Specimen(s): 1. Bladder washings for cytology 2. Right renal washings for cytology 3. Left renal washings for cytology 4. Random bladder biopsies  Drain(s): 31 French Foley catheter, to leg bag  Indications: 54 year old male with history of high-grade urothelial carcinoma. The patient has had 2 resections in the past, has noted intermittent gross hematuria. Because of his history and gross hematuria he presents at this time for endoscopic evaluation including cystoscopy, retrogrades, renal washings and bladder biopsies. Risks and complications the procedure have been discussed with the patient has white. They understand these and desire to proceed.    Technique and findings: The patient was properly identified in the holding area. He received preoperative IV antibiotics. He was taken to the operating room where general anesthetic was administered with the LMA. He was placed in the dorsolithotomy position. Genitalia and perineum were prepped and draped. Proper timeout was performed.  A 23 French panendoscope was advanced under direct vision through his urethra. There was a minimal stricture in the bulbous urethra which was easily navigated without dilation. Prostatic urethra was without lesions, but did have some prominent vasculature. Bladder neck was unremarkable. Prostate was nonobstructive. The bladder was then entered and inspected circumferentially. Generally, there was prominent vascularity throughout the bladder without significant lesion. The left ureteral orifice was somewhat scarred, the right was normal. There were  several patches of decreased vascularity and scar consistent with prior resections. I saw no visible papillary or nodular lesions. Both the 70 and 30 lenses were used to thoroughly inspect the bladder. No bladder calculi were noted. The right ureteral orifice was cannulated with a 6 [place] open-ended catheter with the assistance of a sensor-tip guidewire. It was advanced into the region of the right renal pelvis. Careful irrigation was performed with 10 mL of saline. Washings were sent for cytology labeled "right renal washings".  The syringe was then filled with Omnipaque. A general pyelogram was performed. This revealed normal pyelocalyceal structures without evidence of filling defects or hydronephrosis. Several air bubbles were seen at the UPJ which were aspirated, and at this point, repeat study revealed no evident filling defects. The catheter was backed down to the ureterovesical junction and ureterogram was performed. This revealed normal ureter throughout without evidence of stricture or hydronephrosis or filling defect.  A separate 6 open-ended catheter was used to cannulate the left ureteral orifice. It was easily advanced into the area of the left renal pelvis. At this point, saline was also used to perform a gentle washing. Washings were sent for cytology labeled    "left renal washings".  I then performed a pyelogram with the catheter in the area of the left renal pelvis. Omnipaque was used for this. This revealed a normal pyelocalyceal system. No filling defects were seen, no hydronephrosis noted. The catheter was then backed down to the ureterovesical junction. Ureterogram was then performed, again revealing a normal left ureter without evidence of filling defects, hydronephrosis or stricture.  At this point, the bladder was again inspected. Random bladder biopsies were taken with the cold cup biopsy forceps. Very superficial biopsies were taken, but it was evident the bladder wall was  quite thin. Approximately 6 biopsies were taken and sent labeled "random bladder biopsies". Careful  coagulation of the biopsy sites was performed with the Bugbee electrode until hemostasis was achieved. Biopsies were taken from the bladder neck, posterior bladder wall, right bladder wall, left bladder wall and dome. Following adequate hemostasis, the scope was removed.  A 20 French Foley catheter was advanced into the bladder, the balloon filled with 10 mL of water and this was hooked to dependent drainage. The procedure was then terminated. The patient was awakened and taken to PACU in stable condition. He tolerated the procedure well. Because of the patient's rather thin bladder wall, I feel it necessary to leave the catheter in for proximally 4-5 days.

## 2015-04-05 NOTE — Anesthesia Postprocedure Evaluation (Signed)
Anesthesia Post Note  Patient: Richard Brewer  Procedure(s) Performed: Procedure(s) (LRB): CYSTOSCOPY WITH BILATERAL RETROGRADE PYELOGRAM, BLADDER BIOPSY (Bilateral)  Anesthesia type: General  Patient location: PACU  Post pain: Pain level controlled  Post assessment: Post-op Vital signs reviewed  Last Vitals: BP 116/90 mmHg  Pulse 52  Temp(Src) 36.8 C (Oral)  Resp 17  Ht 5\' 10"  (1.778 m)  Wt 200 lb (90.719 kg)  BMI 28.70 kg/m2  SpO2 95%  Post vital signs: Reviewed  Level of consciousness: sedated  Complications: No apparent anesthesia complications

## 2015-04-05 NOTE — Discharge Instructions (Signed)
1. You may see some blood in the urine and may have some burning with urination for 48-72 hours. You also may notice that you have to urinate more frequently or urgently after your procedure which is normal.  2. You should call should you develop an inability urinate, fever > 101, persistent nausea and vomiting that prevents you from eating or drinking to stay hydrated.  3. If you have a catheter, you will be taught how to take care of the catheter by the nursing staff prior to discharge from the hospital.  You may periodically feel a strong urge to void with the catheter in place.  This is a bladder spasm and most often can occur when having a bowel movement or moving around. It is typically self-limited and usually will stop after a few minutes.  You may use some Vaseline or Neosporin around the tip of the catheter to reduce friction at the tip of the penis. You may also see some blood in the urine.  A very small amount of blood can make the urine look quite red.  As long as the catheter is draining well, there usually is not a problem.  However, if the catheter is not draining well and is bloody, you should call the office [phone]) to notify us.  Leave your catheter in until Monday morning-this will help your bladder both heel and drain better, and if there is some bleeding, help limit that.   CYSTOSCOPY HOME CARE INSTRUCTIONS  Activity: Rest for the remainder of the day.  Do not drive or operate equipment today.  You may resume normal activities in one to two days as instructed by your physician.   Meals: Drink plenty of liquids and eat light foods such as gelatin or soup this evening.  You may return to a normal meal plan tomorrow.  Return to Work: You may return to work in one to two days or as instructed by your physician.  Special Instructions / Symptoms: Call your physician if any of these symptoms occur:   -persistent or heavy bleeding  -bleeding which continues after first few  urination  -large blood clots that are difficult to pass  -urine stream diminishes or stops completely  -fever equal to or higher than 101 degrees Farenheit.  -cloudy urine with a strong, foul odor  -severe pain  Females should always wipe from front to back after elimination.  You may feel some burning pain when you urinate.  This should disappear with time.  Applying moist heat to the lower abdomen or a hot tub bath may help relieve the pain.   Follow-Up / Date of Return Visit to Your Physician:  Call for an appointment to arrange follow-up.    Foley Catheter Care, Adult A Foley catheter is a soft, flexible tube that is placed into the bladder to drain urine. A Foley catheter may be inserted if:  You leak urine or are not able to control when you urinate (urinary incontinence).  You are not able to urinate when you need to (urinary retention).  You had prostate surgery or surgery on the genitals.  You have certain medical conditions, such as multiple sclerosis, dementia, or a spinal cord injury. If you are going home with a Foley catheter in place, follow the instructions below. TAKING CARE OF THE CATHETER 1. Wash your hands with soap and water. 2. Using mild soap and warm water on a clean washcloth:  Clean the area on your body closest to the catheter  insertion site using a circular motion, moving away from the catheter. Never wipe toward the catheter because this could sweep bacteria up into the urethra and cause infection.  Remove all traces of soap. Pat the area dry with a clean towel. For males, reposition the foreskin. 3. Attach the catheter to your leg so there is no tension on the catheter. Use adhesive tape or a leg strap. If you are using adhesive tape, remove any sticky residue left behind by the previous tape you used. 4. Keep the drainage bag below the level of the bladder, but keep it off the floor. 5. Check throughout the day to be sure the catheter is working and  urine is draining freely. Make sure the tubing does not become kinked. 6. Do not pull on the catheter or try to remove it. Pulling could damage internal tissues. TAKING CARE OF THE DRAINAGE BAGS You will be given two drainage bags to take home. One is a large overnight drainage bag, and the other is a smaller leg bag that fits underneath clothing. You may wear the overnight bag at any time, but you should never wear the smaller leg bag at night. Follow the instructions below for how to empty, change, and clean your drainage bags. Emptying the Drainage Bag You must empty your drainage bag when it is  - full or at least 2-3 times a day. 1. Wash your hands with soap and water. 2. Keep the drainage bag below your hips, below the level of your bladder. This stops urine from going back into the tubing and into your bladder. 3. Hold the dirty bag over the toilet or a clean container. 4. Open the pour spout at the bottom of the bag and empty the urine into the toilet or container. Do not let the pour spout touch the toilet, container, or any other surface. Doing so can place bacteria on the bag, which can cause an infection. 5. Clean the pour spout with a gauze pad or cotton ball that has rubbing alcohol on it. 6. Close the pour spout. 7. Attach the bag to your leg with adhesive tape or a leg strap. 8. Wash your hands well. Changing the Drainage Bag Change your drainage bag once a month or sooner if it starts to smell bad or look dirty. Below are steps to follow when changing the drainage bag. 1. Wash your hands with soap and water. 2. Pinch off the rubber catheter so that urine does not spill out. 3. Disconnect the catheter tube from the drainage tube at the connection valve. Do not let the tubes touch any surface. 4. Clean the end of the catheter tube with an alcohol wipe. Use a different alcohol wipe to clean the end of the drainage tube. 5. Connect the catheter tube to the drainage tube of the clean  drainage bag. 6. Attach the new bag to the leg with adhesive tape or a leg strap. Avoid attaching the new bag too tightly. 7. Wash your hands well. Cleaning the Drainage Bag 1. Wash your hands with soap and water. 2. Wash the bag in warm, soapy water. 3. Rinse the bag thoroughly with warm water. 4. Fill the bag with a solution of white vinegar and water (1 cup vinegar to 1 qt warm water [.2 L vinegar to 1 L warm water]). Close the bag and soak it for 30 minutes in the solution. 5. Rinse the bag with warm water. 6. Hang the bag to dry with the pour  spout open and hanging downward. 7. Store the clean bag (once it is dry) in a clean plastic bag. 8. Wash your hands well. PREVENTING INFECTION  Wash your hands before and after handling your catheter.  Take showers daily and wash the area where the catheter enters your body. Do not take baths. Replace wet leg straps with dry ones, if this applies.  Do not use powders, sprays, or lotions on the genital area. Only use creams, lotions, or ointments as directed by your caregiver.  For females, wipe from front to back after each bowel movement.  Drink enough fluids to keep your urine clear or pale yellow unless you have a fluid restriction.  Do not let the drainage bag or tubing touch or lie on the floor.  Wear cotton underwear to absorb moisture and to keep your skin drier. SEEK MEDICAL CARE IF:   Your urine is cloudy or smells unusually bad.  Your catheter becomes clogged.  You are not draining urine into the bag or your bladder feels full.  Your catheter starts to leak. SEEK IMMEDIATE MEDICAL CARE IF:   You have pain, swelling, redness, or pus where the catheter enters the body.  You have pain in the abdomen, legs, lower back, or bladder.  You have a fever.  You see blood fill the catheter, or your urine is pink or red.  You have nausea, vomiting, or chills.  Your catheter gets pulled out. MAKE SURE YOU:   Understand these  instructions.  Will watch your condition.  Will get help right away if you are not doing well or get worse.   This information is not intended to replace advice given to you by your health care provider. Make sure you discuss any questions you have with your health care provider.   Document Released: 06/02/2005 Document Revised: 10/17/2013 Document Reviewed: 05/24/2012 Elsevier Interactive Patient Education 2016 [place] Anesthesia Home Care Instructions  Activity: Get plenty of rest for the remainder of the day. A responsible adult should stay with you for 24 hours following the procedure.  For the next 24 hours, DO NOT: -Drive a car -Paediatric nurse -Drink alcoholic beverages -Take any medication unless instructed by your physician -Make any legal decisions or sign important papers.  Meals: Start with liquid foods such as gelatin or soup. Progress to regular foods as tolerated. Avoid greasy, spicy, heavy foods. If nausea and/or vomiting occur, drink only clear liquids until the nausea and/or vomiting subsides. Call your physician if vomiting continues.  Special Instructions/Symptoms: Your throat may feel dry or sore from the anesthesia or the breathing tube placed in your throat during surgery. If this causes discomfort, gargle with warm salt water. The discomfort should disappear within 24 hours.  If you had a scopolamine patch placed behind your ear for the management of post- operative nausea and/or vomiting:  1. The medication in the patch is effective for 72 hours, after which it should be removed.  Wrap patch in a tissue and discard in the trash. Wash hands thoroughly with soap and water. 2. You may remove the patch earlier than 72 hours if you experience unpleasant side effects which may include dry mouth, dizziness or visual disturbances. 3. Avoid touching the patch. Wash your hands with soap and water after contact with the patch.

## 2015-04-06 ENCOUNTER — Encounter (HOSPITAL_BASED_OUTPATIENT_CLINIC_OR_DEPARTMENT_OTHER): Payer: Self-pay | Admitting: Urology

## 2015-10-10 ENCOUNTER — Other Ambulatory Visit: Payer: Self-pay | Admitting: Urology

## 2015-10-11 ENCOUNTER — Encounter (HOSPITAL_BASED_OUTPATIENT_CLINIC_OR_DEPARTMENT_OTHER): Payer: Self-pay | Admitting: *Deleted

## 2015-10-11 NOTE — Progress Notes (Signed)
Hx reviewed. Pt states no change in hx since 10/16 visit.  Pt instructed npo pmn 5/3 x toprol w sip of water. To [facility] 5/4 @ 0600.  Needs istat on arrival.  ekg in epic.

## 2015-10-19 ENCOUNTER — Encounter (HOSPITAL_BASED_OUTPATIENT_CLINIC_OR_DEPARTMENT_OTHER): Payer: Self-pay | Admitting: *Deleted

## 2015-10-19 NOTE — Progress Notes (Signed)
NPO AFTER MN.  ARRIVE AT 0600.  NEEDS ISTAT .  CURRENT EKG IN CHART AND EPIC.  WILL TAKE METOPROLOL AM DOS W/ SIPS OF WATER.

## 2015-10-28 NOTE — Anesthesia Preprocedure Evaluation (Addendum)
Anesthesia Evaluation  Patient identified by MRN, date of birth, ID band Patient awake    Reviewed: Allergy & Precautions, H&P , NPO status , Patient's Chart, lab work & pertinent test results, reviewed documented beta blocker date and time   Airway Mallampati: II  TM Distance: >3 FB Neck ROM: Full    Dental  (+) Dental Advisory Given,    Pulmonary former smoker,    breath sounds clear to auscultation       Cardiovascular hypertension, Pt. on home beta blockers and Pt. on medications + CAD, + Past MI and + CABG (2014)   Rhythm:Regular Rate:Normal     Neuro/Psych negative neurological ROS  negative psych ROS   GI/Hepatic Neg liver ROS, GERD  ,  Endo/Other  Obesity   Renal/GU Renal disease   Prostate cancer    Musculoskeletal negative musculoskeletal ROS (+)   Abdominal   Peds  Hematology negative hematology ROS (+)   Anesthesia Other Findings Upper front bridge  Reproductive/Obstetrics                           Anesthesia Physical Anesthesia Plan  ASA: III  Anesthesia Plan: General   Post-op Pain Management:    Induction: Intravenous  Airway Management Planned: LMA  Additional Equipment:   Intra-op Plan:   Post-operative Plan: Extubation in OR  Informed Consent: I have reviewed the patients History and Physical, chart, labs and discussed the procedure including the risks, benefits and alternatives for the proposed anesthesia with the patient or authorized representative who has indicated his/her understanding and acceptance.   Dental advisory given  Plan Discussed with: CRNA and Surgeon  Anesthesia Plan Comments: (Risks/benefits of general anesthesia discussed with patient including risk of damage to teeth, lips, gum, and tongue, nausea/vomiting, allergic reactions to medications, and the possibility of heart attack, stroke and death.  All patient questions answered.   Patient wishes to proceed.)       Anesthesia Quick Evaluation

## 2015-10-29 ENCOUNTER — Encounter (HOSPITAL_BASED_OUTPATIENT_CLINIC_OR_DEPARTMENT_OTHER): Payer: Self-pay | Admitting: *Deleted

## 2015-10-29 ENCOUNTER — Ambulatory Visit (HOSPITAL_BASED_OUTPATIENT_CLINIC_OR_DEPARTMENT_OTHER): Payer: 59 | Admitting: Anesthesiology

## 2015-10-29 ENCOUNTER — Encounter (HOSPITAL_BASED_OUTPATIENT_CLINIC_OR_DEPARTMENT_OTHER)
Admission: RE | Disposition: A | Discharge status: Home / Self Care | Payer: Self-pay | Source: Ambulatory Visit | Attending: Urology

## 2015-10-29 ENCOUNTER — Ambulatory Visit (HOSPITAL_BASED_OUTPATIENT_CLINIC_OR_DEPARTMENT_OTHER)
Admission: RE | Admit: 2015-10-29 | Discharge: 2015-10-29 | Disposition: A | Discharge status: Home / Self Care | Payer: 59 | Source: Ambulatory Visit | Attending: Urology | Admitting: Urology

## 2015-10-29 DIAGNOSIS — N3289 Other specified disorders of bladder: Secondary | ICD-10-CM | POA: Diagnosis not present

## 2015-10-29 DIAGNOSIS — E669 Obesity, unspecified: Secondary | ICD-10-CM | POA: Insufficient documentation

## 2015-10-29 DIAGNOSIS — Z951 Presence of aortocoronary bypass graft: Secondary | ICD-10-CM | POA: Diagnosis not present

## 2015-10-29 DIAGNOSIS — Z8551 Personal history of malignant neoplasm of bladder: Secondary | ICD-10-CM | POA: Insufficient documentation

## 2015-10-29 DIAGNOSIS — N3021 Other chronic cystitis with hematuria: Secondary | ICD-10-CM | POA: Diagnosis not present

## 2015-10-29 DIAGNOSIS — I2510 Atherosclerotic heart disease of native coronary artery without angina pectoris: Secondary | ICD-10-CM | POA: Diagnosis not present

## 2015-10-29 DIAGNOSIS — Z79891 Long term (current) use of opiate analgesic: Secondary | ICD-10-CM | POA: Insufficient documentation

## 2015-10-29 DIAGNOSIS — I10 Essential (primary) hypertension: Secondary | ICD-10-CM | POA: Insufficient documentation

## 2015-10-29 DIAGNOSIS — I252 Old myocardial infarction: Secondary | ICD-10-CM | POA: Diagnosis not present

## 2015-10-29 DIAGNOSIS — Z7982 Long term (current) use of aspirin: Secondary | ICD-10-CM | POA: Insufficient documentation

## 2015-10-29 DIAGNOSIS — Z87891 Personal history of nicotine dependence: Secondary | ICD-10-CM | POA: Insufficient documentation

## 2015-10-29 DIAGNOSIS — Z79899 Other long term (current) drug therapy: Secondary | ICD-10-CM | POA: Insufficient documentation

## 2015-10-29 DIAGNOSIS — Z6830 Body mass index (BMI) 30.0-30.9, adult: Secondary | ICD-10-CM | POA: Diagnosis not present

## 2015-10-29 DIAGNOSIS — C673 Malignant neoplasm of anterior wall of bladder: Secondary | ICD-10-CM

## 2015-10-29 DIAGNOSIS — Z8546 Personal history of malignant neoplasm of prostate: Secondary | ICD-10-CM | POA: Diagnosis not present

## 2015-10-29 DIAGNOSIS — R319 Hematuria, unspecified: Secondary | ICD-10-CM | POA: Diagnosis present

## 2015-10-29 DIAGNOSIS — E785 Hyperlipidemia, unspecified: Secondary | ICD-10-CM | POA: Diagnosis not present

## 2015-10-29 HISTORY — PX: CYSTOSCOPY WITH BIOPSY: SHX5122

## 2015-10-29 HISTORY — DX: Other neuromuscular dysfunction of bladder: N31.8

## 2015-10-29 HISTORY — DX: Gastro-esophageal reflux disease without esophagitis: K21.9

## 2015-10-29 LAB — POCT I-STAT, CHEM 8
BUN: 25 mg/dL — ABNORMAL HIGH (ref 6–20)
Calcium, Ion: 1.26 mmol/L — ABNORMAL HIGH (ref 1.12–1.23)
Chloride: 105 mmol/L (ref 101–111)
Creatinine, Ser: 0.9 mg/dL (ref 0.61–1.24)
GLUCOSE: 112 mg/dL — AB (ref 65–99)
HCT: 50 % (ref 39.0–52.0)
HEMOGLOBIN: 17 g/dL (ref 13.0–17.0)
POTASSIUM: 4.5 mmol/L (ref 3.5–5.1)
SODIUM: 142 mmol/L (ref 135–145)
TCO2: 23 mmol/L (ref 0–100)

## 2015-10-29 SURGERY — CYSTOSCOPY, WITH BIOPSY
Anesthesia: General | Site: Bladder

## 2015-10-29 MED ORDER — BELLADONNA ALKALOIDS-OPIUM 16.2-60 MG RE SUPP
RECTAL | Status: AC
  Filled 2015-10-29: qty 1

## 2015-10-29 MED ORDER — FENTANYL CITRATE (PF) 100 MCG/2ML IJ SOLN
25.0000 ug | INTRAMUSCULAR | Status: DC | PRN
  Administered 2015-10-29: 25 ug via INTRAVENOUS
  Filled 2015-10-29: qty 1

## 2015-10-29 MED ORDER — CEFAZOLIN SODIUM 1-5 GM-% IV SOLN
1.0000 g | INTRAVENOUS | Status: DC
  Filled 2015-10-29: qty 50

## 2015-10-29 MED ORDER — FENTANYL CITRATE (PF) 100 MCG/2ML IJ SOLN
INTRAMUSCULAR | Status: AC
  Filled 2015-10-29: qty 2

## 2015-10-29 MED ORDER — CEPHALEXIN 500 MG PO CAPS: 500.0000 mg | ORAL_CAPSULE | Freq: Two times a day (BID) | ORAL | Status: DC

## 2015-10-29 MED ORDER — DEXAMETHASONE SODIUM PHOSPHATE 4 MG/ML IJ SOLN
INTRAMUSCULAR | Status: DC | PRN
  Administered 2015-10-29: 10 mg via INTRAVENOUS

## 2015-10-29 MED ORDER — MIDAZOLAM HCL 2 MG/2ML IJ SOLN
INTRAMUSCULAR | Status: AC
  Filled 2015-10-29: qty 2

## 2015-10-29 MED ORDER — LIDOCAINE HCL (CARDIAC) 20 MG/ML IV SOLN
INTRAVENOUS | Status: AC
  Filled 2015-10-29: qty 5

## 2015-10-29 MED ORDER — STERILE WATER FOR IRRIGATION IR SOLN
Status: DC | PRN
  Administered 2015-10-29 (×2): 3000 mL

## 2015-10-29 MED ORDER — HYDROCODONE-ACETAMINOPHEN 7.5-325 MG PO TABS
1.0000 | ORAL_TABLET | Freq: Four times a day (QID) | ORAL | Status: DC | PRN
  Administered 2015-10-29: 1 via ORAL
  Filled 2015-10-29: qty 1

## 2015-10-29 MED ORDER — CEFAZOLIN SODIUM-DEXTROSE 2-4 GM/100ML-% IV SOLN
INTRAVENOUS | Status: AC
  Filled 2015-10-29: qty 100

## 2015-10-29 MED ORDER — DEXAMETHASONE SODIUM PHOSPHATE 10 MG/ML IJ SOLN
INTRAMUSCULAR | Status: AC
  Filled 2015-10-29: qty 1

## 2015-10-29 MED ORDER — PROPOFOL 10 MG/ML IV BOLUS
INTRAVENOUS | Status: AC
  Filled 2015-10-29: qty 40

## 2015-10-29 MED ORDER — HYDROCODONE-ACETAMINOPHEN 7.5-325 MG PO TABS
ORAL_TABLET | ORAL | Status: AC
  Filled 2015-10-29: qty 1

## 2015-10-29 MED ORDER — ARTIFICIAL TEARS OP OINT
TOPICAL_OINTMENT | OPHTHALMIC | Status: AC
  Filled 2015-10-29: qty 3.5

## 2015-10-29 MED ORDER — MIDAZOLAM HCL 5 MG/5ML IJ SOLN
INTRAMUSCULAR | Status: DC | PRN
  Administered 2015-10-29: 2 mg via INTRAVENOUS

## 2015-10-29 MED ORDER — ONDANSETRON HCL 4 MG/2ML IJ SOLN
INTRAMUSCULAR | Status: AC
  Filled 2015-10-29: qty 2

## 2015-10-29 MED ORDER — BACITRACIN-NEOMYCIN-POLYMYXIN 400-5-5000 EX OINT
TOPICAL_OINTMENT | CUTANEOUS | Status: AC
  Filled 2015-10-29: qty 1

## 2015-10-29 MED ORDER — SODIUM CHLORIDE 0.9 % IR SOLN
Status: DC | PRN
  Administered 2015-10-29: 1000 mL via INTRAVESICAL

## 2015-10-29 MED ORDER — LACTATED RINGERS IV SOLN
INTRAVENOUS | Status: DC
  Administered 2015-10-29: 07:00:00 via INTRAVENOUS
  Filled 2015-10-29: qty 1000

## 2015-10-29 MED ORDER — CEFAZOLIN SODIUM-DEXTROSE 2-4 GM/100ML-% IV SOLN
2.0000 g | INTRAVENOUS | Status: AC
  Administered 2015-10-29: 2 g via INTRAVENOUS
  Filled 2015-10-29: qty 100

## 2015-10-29 MED ORDER — LIDOCAINE HCL (CARDIAC) 20 MG/ML IV SOLN
INTRAVENOUS | Status: DC | PRN
  Administered 2015-10-29: 100 mg via INTRAVENOUS

## 2015-10-29 MED ORDER — PROPOFOL 10 MG/ML IV BOLUS
INTRAVENOUS | Status: DC | PRN
  Administered 2015-10-29: 200 mg via INTRAVENOUS

## 2015-10-29 MED ORDER — FENTANYL CITRATE (PF) 100 MCG/2ML IJ SOLN
INTRAMUSCULAR | Status: DC | PRN
  Administered 2015-10-29 (×2): 50 ug via INTRAVENOUS

## 2015-10-29 MED ORDER — KETOROLAC TROMETHAMINE 30 MG/ML IJ SOLN
INTRAMUSCULAR | Status: DC | PRN
  Administered 2015-10-29: 30 mg via INTRAVENOUS

## 2015-10-29 MED ORDER — ONDANSETRON HCL 4 MG/2ML IJ SOLN
INTRAMUSCULAR | Status: DC | PRN
  Administered 2015-10-29: 4 mg via INTRAVENOUS

## 2015-10-29 MED ORDER — OXYBUTYNIN CHLORIDE 5 MG PO TABS: 5.0000 mg | ORAL_TABLET | Freq: Three times a day (TID) | ORAL | Status: DC

## 2015-10-29 MED ORDER — BELLADONNA ALKALOIDS-OPIUM 16.2-60 MG RE SUPP
RECTAL | Status: DC | PRN
  Administered 2015-10-29: 1 via RECTAL

## 2015-10-29 MED ORDER — PROMETHAZINE HCL 25 MG/ML IJ SOLN
6.2500 mg | INTRAMUSCULAR | Status: DC | PRN
  Filled 2015-10-29: qty 1

## 2015-10-29 SURGICAL SUPPLY — 26 items
BAG DRAIN URO-CYSTO SKYTR STRL (DRAIN) ×3 IMPLANT
BAG URINE DRAINAGE (UROLOGICAL SUPPLIES) ×3 IMPLANT
CATH FOLEY 2WAY SLVR  5CC 20FR (CATHETERS) ×2
CATH FOLEY 2WAY SLVR 5CC 20FR (CATHETERS) ×1 IMPLANT
CLOTH BEACON ORANGE TIMEOUT ST (SAFETY) ×3 IMPLANT
ELECT REM PT RETURN 9FT ADLT (ELECTROSURGICAL) ×3
ELECTRODE REM PT RTRN 9FT ADLT (ELECTROSURGICAL) ×1 IMPLANT
GLOVE BIO SURGEON STRL SZ8 (GLOVE) ×3 IMPLANT
GLOVE SURG SS PI 7.5 STRL IVOR (GLOVE) ×6 IMPLANT
GOWN STRL REUS W/ TWL LRG LVL3 (GOWN DISPOSABLE) ×1 IMPLANT
GOWN STRL REUS W/ TWL XL LVL3 (GOWN DISPOSABLE) ×1 IMPLANT
GOWN STRL REUS W/TWL LRG LVL3 (GOWN DISPOSABLE) ×2
GOWN STRL REUS W/TWL XL LVL3 (GOWN DISPOSABLE) ×5 IMPLANT
IV NS 1000ML (IV SOLUTION)
IV NS 1000ML BAXH (IV SOLUTION) IMPLANT
KIT ROOM TURNOVER WOR (KITS) ×3 IMPLANT
MANIFOLD NEPTUNE II (INSTRUMENTS) ×3 IMPLANT
NDL SAFETY ECLIPSE 18X1.5 (NEEDLE) IMPLANT
NEEDLE HYPO 18GX1.5 SHARP (NEEDLE)
NEEDLE HYPO 22GX1.5 SAFETY (NEEDLE) IMPLANT
NS IRRIG 500ML POUR BTL (IV SOLUTION) IMPLANT
PACK CYSTO (CUSTOM PROCEDURE TRAY) ×3 IMPLANT
SYR 20CC LL (SYRINGE) IMPLANT
TUBE CONNECTING 12'X1/4 (SUCTIONS) ×1
TUBE CONNECTING 12X1/4 (SUCTIONS) ×2 IMPLANT
WATER STERILE IRR 3000ML UROMA (IV SOLUTION) ×6 IMPLANT

## 2015-10-29 NOTE — Anesthesia Procedure Notes (Signed)
Procedure Name: LMA Insertion Date/Time: 10/29/2015 7:47 AM Performed by: Mechele Claude Pre-anesthesia Checklist: Patient identified, Emergency Drugs available, Suction available and Patient being monitored Patient Re-evaluated:Patient Re-evaluated prior to inductionOxygen Delivery Method: Circle System Utilized Preoxygenation: Pre-oxygenation with 100% oxygen Intubation Type: IV induction Ventilation: Mask ventilation without difficulty LMA: LMA inserted LMA Size: 4.0 Number of attempts: 1 Airway Equipment and Method: bite block Placement Confirmation: positive ETCO2 Tube secured with: Tape Dental Injury: Teeth and Oropharynx as per pre-operative assessment

## 2015-10-29 NOTE — Anesthesia Postprocedure Evaluation (Signed)
Anesthesia Post Note  Patient: Richard Brewer  Procedure(s) Performed: Procedure(s) (LRB): CYSTOSCOPY WITH BIOPSY (N/A)  Patient location during evaluation: PACU Anesthesia Type: General Level of consciousness: awake and alert Pain management: pain level controlled Vital Signs Assessment: post-procedure vital signs reviewed and stable Respiratory status: spontaneous breathing, nonlabored ventilation, respiratory function stable and patient connected to nasal cannula oxygen Cardiovascular status: blood pressure returned to baseline and stable Postop Assessment: no signs of nausea or vomiting Anesthetic complications: no    Last Vitals:  Filed Vitals:   10/29/15 0915 10/29/15 0930  BP: 126/73 122/77  Pulse: 45 43  Temp: 36.5 C   Resp: 12 11    Last Pain:  Filed Vitals:   10/29/15 0935  PainSc: 3                  Catalina Gravel

## 2015-10-29 NOTE — Transfer of Care (Signed)
Last Vitals:  Filed Vitals:   10/29/15 0622  BP: 135/78  Pulse: 59  Temp: 37.1 C  Resp: 16    Last Pain: There were no vitals filed for this visit.    Patients Stated Pain Goal: 6 (10/29/15 [id number])  Immediate Anesthesia Transfer of Care Note  Patient: Richard Brewer  Procedure(s) Performed: Procedure(s) (LRB): CYSTOSCOPY WITH BIOPSY (N/A)  Patient Location: PACU  Anesthesia Type: General  Level of Consciousness: awake, alert  and oriented  Airway & Oxygen Therapy: Patient Spontanous Breathing and Patient connected to nasal cannula oxygen  Post-op Assessment: Report given to PACU RN and Post -op Vital signs reviewed and stable  Post vital signs: Reviewed and stable  Complications: No apparent anesthesia complications

## 2015-10-29 NOTE — H&P (Signed)
Urology History and Physical Exam  CC: Bladder cancer history, blood in urine  HPI: 55 year old male who presents for cysto and bladder biopsies. His history is as follows:  He initially presented to our practice in late 2012 microscopic hematuria. Evaluation revealed bladder cancer. He underwent TURBT on 05/02/2011. The lesion was located in the left bladder wall lateral to the left ureteral orifice. It is unknown if he received mitomycin during his original resection. 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 The lesion was measured at approximately 4 cm in size.   He underwent induction BCG which was completed on 07/25/2011.  Repeat biopsy was performed on 08/08/2011. All biopsies were negative, cytology of bladder washings was negative.  He underwent repeat TURBT on 11/28/2011. A small lesion revealed high-grade papillary urothelial carcinoma, again without evidence of invasion.  He completed a second induction BCG course on 02/10/2012.  He completed a maintenance BCG, last given in July, 2015.  Hematuria protocol CT scan in January/2016 was negative.  At his last visit here in July 2016, cystoscopy and cytology were negative.  The patient had significant gross hematuria in late August, 2016. That required catheter drainage, irrigation, and coming off of his aspirin. He is now on 81 mg of aspirin every other day.  He underwent anesthetic cystoscopy, bilateral retrograde ureteral pyelograms, bladder barbotage and bladder biopsy on 04/05/2015. Bilateral renal cytologies were negative, as was the bladder washings. Bladder biopsy revealed benign findings-denuded urothelial mucosa with underlying inflammation.  Recent surveillance cysto revealed low lying papillary lesions at the bladder neck.  PMH: Past Medical History  Diagnosis Date  . Lumbar herniated disc     x2 disc-- occ. pain-- goes to chiapractor  . History of concussion    1970's w/ LOC---  no residual  . History of bladder cancer urologist-  dr Diona Fanti    2012  &  2013 x2  s/p TURBT/  instillation mitomycin c and bcg tx's  . Bilateral renal cysts   . History of prostate cancer     T1c  ,  Gleason 4+3---  05-02-2011  Radioactive prostate seed implants  . History of urinary retention     02-03-2015  . 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 Coronary artery disease   . History of MI (myocardial infarction)     2014  . Hyperlipidemia   . Wears glasses   . GERD (gastroesophageal reflux disease)   . Frequency-urgency syndrome     PSH: Past Surgical History  Procedure Laterality Date  . Pilonidal cyst excision  1978  . Transurethral resection of bladder tumor  05/02/2011    Procedure: CYSTOSCOPY/ RETROGRADE PYELOGRAM / LEFT URTEROSCOPY/ TRANSURETHRAL RESECTION OF BLADDER TUMOR (TURBT);  Surgeon: Hanley Ben, MD;  Location: [facility];  Service: Urology;  Laterality: N/A; MYTOMYCIN C  . Transurethral resection of bladder tumor  08/08/2011    Procedure: TRANSURETHRAL RESECTION OF BLADDER TUMOR (TURBT);  Surgeon: Hanley Ben, MD;  Location: [facility];  Service: Urology;  Laterality: N/A;  TUR BLADDER BX  . Transurethral resection of bladder tumor  11/28/2011    Procedure: TRANSURETHRAL RESECTION OF BLADDER TUMOR (TURBT);  Surgeon: Hanley Ben, MD;  Location: [facility];  Service: Urology;  Laterality: N/A;  . Cystoscopy  11/28/2011    Procedure: CYSTOSCOPY;  Surgeon: Hanley Ben, MD;  Location: [facility];  Service: Urology;  Laterality: N/A;  . Inguinal hernia repair  Bilateral INFANT  . Radioactive prostate seed implants  05-02-2011  . Coronary artery bypass graft  Nov 2014    LIMA to LAD/  SVG to OM/  SVG to RCA  . Achilles tendon repair Right May 2015  . Cystoscopy w/ retrogrades Bilateral 04/05/2015    Procedure: CYSTOSCOPY WITH BILATERAL RETROGRADE PYELOGRAM, BLADDER BIOPSY;  Surgeon:  Franchot Gallo, MD;  Location: [facility];  Service: Urology;  Laterality: Bilateral;    Allergies: No Known Allergies  Medications: Prescriptions prior to admission  Medication Sig Dispense Refill Last Dose  . acetaminophen (TYLENOL) 325 MG tablet Take 650 mg by mouth every 6 (six) hours as needed.   Past Week at Unknown time  . aspirin 81 MG tablet Take 81 mg by mouth daily.     . cyclobenzaprine (FLEXERIL) 10 MG tablet Take 10 mg by mouth 3 (three) times daily as needed for muscle spasms.   Past Week at Unknown time  . finasteride (PROSCAR) 5 MG tablet Take 5 mg by mouth daily.     Marland Kitchen HYDROcodone-acetaminophen (NORCO) 7.5-325 MG tablet Take 1 tablet by mouth every 6 (six) hours as needed for moderate pain.   Past Week at Unknown time  . metoprolol succinate (TOPROL-XL) 50 MG 24 hr tablet Take 1 tablet by mouth every morning.    04/05/2015 at 0530  . rosuvastatin (CRESTOR) 20 MG tablet Take 40 mg by mouth every evening.    04/04/2015 at Unknown time     Social History: Social History   Social History  . Marital Status: Married    Spouse Name: N/A  . Number of Children: N/A  . Years of Education: N/A   Occupational History  . Not on file.   Social History Main Topics  . Smoking status: Former Smoker -- 1.00 packs/day for 18 years    Types: Cigarettes    Quit date: 04/30/2009  . Smokeless tobacco: Never Used  . Alcohol Use: 0.0 oz/week     Comment: OCCASIONAL  . Drug Use: No  . Sexual Activity: Not on file   Other Topics Concern  . Not on file   Social History Narrative    Family History: History reviewed. No pertinent family history.  Review of Systems: Positive: Hematuria Negative:  A further 10 point review of systems was negative except what is listed in the HPI.                  Physical Exam: @VITALS2 @ General: No acute distress.  Awake. Head:  Normocephalic.  Atraumatic. ENT:  EOMI.  Mucous membranes moist Neck:  Supple.  No  lymphadenopathy. CV:  S1 present. S2 present. Regular rate. Pulmonary: Equal effort bilaterally.  Clear to auscultation bilaterally. Abdomen: Soft.  Non tender to palpation. Skin:  Normal turgor.  No visible rash. Extremity: No gross deformity of bilateral upper extremities.  No gross deformity of                             lower extremities. Neurologic: Alert. Appropriate mood.    Studies:  No results for input(s): HGB, WBC, PLT in the last 72 hours.  No results for input(s): NA, K, CL, CO2, BUN, CREATININE, CALCIUM, GFRNONAA, GFRAA in the last 72 hours.  Invalid input(s): MAGNESIUM   No results for input(s): INR, APTT in the last 72 hours.  Invalid input(s): PT   Invalid input(s): ABG    Assessment:  History of bladder  cancer with recent suspicious lesions on cysto  Plan: Cysto, bladder biopsies

## 2015-10-29 NOTE — Op Note (Signed)
PATIENT:  Richard Brewer  PRE-OPERATIVE DIAGNOSIS: History of bladder cancer with bladder neck lesion and recurrent hematuria  POST-OPERATIVE DIAGNOSIS: Same  PROCEDURE: Cystoscopy, bladder biopsies  SURGEON:  Lillette Boxer. Tomorrow Dehaas, M.D.  ANESTHESIA:  General  EBL:  Minimal  DRAINS:  20 French Foley catheter  LOCAL MEDICATIONS USED:  None  SPECIMEN:    INDICATION: [name] is a 55 year old male with a history of bladder cancer dating back to 2013 with his initial resection. He has had BCG induction and maintenance. He underwent cystoscopy and bladder biopsy as well as retrograde ureteropyelograms in October, 2016 for hematuria. All biopsies were negative. He has had persistent hematuria, and recent cystoscopy revealed low-lying bladder lesions at his bladder neck anteriorly. He presents now for biopsy.  Description of procedure: The patient was properly identified and marked (if applicable) in the holding area. They were then  taken to the operating room and placed on the table in a supine position. General anesthesia was then administered. Once fully anesthetized the patient was moved to the dorsolithotomy position and the genitalia and perineum were sterilely prepped and draped in standard fashion. An official timeout was then performed.  A 23 French panendoscope was advanced into the bladder. Urethra was free of lesions, prostate was not obstructive. The bladder was entered and inspected circumferentially. There was a scar in the trigonal area, no other bladder lesions were noted except for some erythematous lesions, with a low papillary configuration, at the bladder neck area aneriorly. Ureteral orifices were normal. There were no stones. Inspection was made with both the 30 and 70 lenses. Following thorough inspection, the cold cup biopsy forceps for used with a 30 lens to biopsy the anterior bladder lesions at the bladder neck. Approximate 7-8 biopsies were taken, totally removing  the abnormal appearing urothelium. These were sent as "bladder neck biopsies". Following this, the cold cup forceps were removed, and a Bugbee electrode was used to cauterize the entire biopsied sites at the bladder neck until hemostasis was achieved. At this point, the scope was removed and a 32 [place] Foley catheter was placed and hooked to dependent drainage following filling the balloon with 10 mL of water.  The patient tolerated procedure well. He was awakened and taken to the PACU in stable condition. There was minimal blood loss.    PLAN OF CARE: Discharge to home after PACU  PATIENT DISPOSITION:  PACU - hemodynamically stable.

## 2015-10-29 NOTE — Discharge Instructions (Signed)
1. You may see some blood in the urine and may have some burning with urination for 48-72 hours. You also may notice that you have to urinate more frequently or urgently after your procedure which is normal.  2. You should call should you develop an inability urinate, fever > 101, persistent nausea and vomiting that prevents you from eating or drinking to stay hydrated.  If you have a catheter, you will be taught how to take care of the catheter by the nursing staff prior to discharge from the hospital. It is okay to remove the catheter on Tuesday morning. You may periodically feel a strong urge to void with the catheter in place.  This is a bladder spasm and most often can occur when having a bowel movement or moving around. It is typically self-limited and usually will stop after a few minutes.  You may use some Vaseline or Neosporin around the tip of the catheter to reduce friction at the tip of the penis. You may also see some blood in the urine.  A very small amount of blood can make the urine look quite red.  As long as the catheter is draining well, there usually is not a problem.  However, if the catheter is not draining well and is bloody, you should call the office [phone]) to notify us. Post Anesthesia Home Care Instructions  Activity: Get plenty of rest for the remainder of the day. A responsible adult should stay with you for 24 hours following the procedure.  For the next 24 hours, DO NOT: -Drive a car -Paediatric nurse -Drink alcoholic beverages -Take any medication unless instructed by your physician -Make any legal decisions or sign important papers.  Meals: Start with liquid foods such as gelatin or soup. Progress to regular foods as tolerated. Avoid greasy, spicy, heavy foods. If nausea and/or vomiting occur, drink only clear liquids until the nausea and/or vomiting subsides. Call your physician if vomiting continues.  Special Instructions/Symptoms: Your throat may feel  dry or sore from the anesthesia or the breathing tube placed in your throat during surgery. If this causes discomfort, gargle with warm salt water. The discomfort should disappear within 24 hours.  If you had a scopolamine patch placed behind your ear for the management of post- operative nausea and/or vomiting:  1. The medication in the patch is effective for 72 hours, after which it should be removed.  Wrap patch in a tissue and discard in the trash. Wash hands thoroughly with soap and water. 2. You may remove the patch earlier than 72 hours if you experience unpleasant side effects which may include dry mouth, dizziness or visual disturbances. 3. Avoid touching the patch. Wash your hands with soap and water after contact with the patch.   3.

## 2015-10-30 ENCOUNTER — Encounter (HOSPITAL_BASED_OUTPATIENT_CLINIC_OR_DEPARTMENT_OTHER): Payer: Self-pay | Admitting: Urology

## 2016-09-09 ENCOUNTER — Other Ambulatory Visit: Payer: Self-pay | Admitting: Urology

## 2016-09-10 ENCOUNTER — Encounter (HOSPITAL_BASED_OUTPATIENT_CLINIC_OR_DEPARTMENT_OTHER): Payer: Self-pay | Admitting: *Deleted

## 2016-09-11 ENCOUNTER — Encounter (HOSPITAL_BASED_OUTPATIENT_CLINIC_OR_DEPARTMENT_OTHER): Payer: Self-pay | Admitting: *Deleted

## 2016-09-11 NOTE — Progress Notes (Addendum)
NPO AFTER MN.  ARRIVE AT 0600.  NEEDS ISTAT.  CURRENT EKG AND LOV NOTE REQUEST TO BE FAXED FROM DR RENALDO (CARDIOLOGIST [facility]/ [facility]).  WILL TAKE METOPROLOL AND OXYBUTYIN AM DOS W/ SIPS OF WATER.   ADDENDUM:  RECEIVED INFORMATION REQUESTED FROM DR RENALDO.  EKG IS NOT CURRENT WILL NEED ON ARRIVAL DOS.

## 2016-09-17 ENCOUNTER — Encounter (HOSPITAL_BASED_OUTPATIENT_CLINIC_OR_DEPARTMENT_OTHER): Payer: Self-pay | Admitting: *Deleted

## 2016-09-21 NOTE — H&P (Signed)
Urology History and Physical Exam  CC: Gross hematuria, H/O bladder cancer  HPI: 56 year old male presents for TURP for mgmt of a h/o urothelial ca of the bladder. He has been treated with TURBT as well as BCG for his high grade bladder cancer. He has been experiencing dysuria and gross hematuria. 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 Because of his persistent sx's as well as his gross hematuria, it is felt that his pathology lies at his BN/prostatic urethra, and he presents for TURP/TURBN.   PMH: Past Medical History:  Diagnosis Date  . Bilateral renal cysts   . Chronic low back pain   . Coronary artery disease    cardiologist-  dr Mauricio Po ([facility])  . Frequency-urgency syndrome   . GERD (gastroesophageal reflux disease)   . Hematuria   . History of bladder cancer urologist-  dr Diona Fanti   2012  &  2013 x2  s/p TURBT/  instillation mitomycin c and bcg tx's  . History of concussion    1970's w/ LOC---  no residual  . History of MI (myocardial infarction) 04/19/2013   NSTEMI  . History of non-ST elevation myocardial infarction (NSTEMI)    11/ 2014  s/p  cabg  . History of prostate cancer    T1c  ,  Gleason 4+3---  05-02-2011  Radioactive prostate seed implants  . History of urinary retention    02-03-2015  . Hyperlipidemia   . Local recurrence of cancer of urinary bladder ([place])   . Lumbar herniated disc    x2 disc-- occ. pain-- goes to chiapractor  . 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 Wears glasses     PSH: Past Surgical History:  Procedure Laterality Date  . ACHILLES TENDON REPAIR Right 10/27/2013  . CARDIAC CATHETERIZATION  04/20/2013  . CORONARY ARTERY BYPASS GRAFT  04/21/2013   at [facility])   LIMA to LAD/  SVG to OM/  SVG to RCA  . CYSTOSCOPY  11/28/2011   Procedure: CYSTOSCOPY;  Surgeon: Hanley Ben, MD;  Location: [facility];  Service: Urology;  Laterality: N/A;  . CYSTOSCOPY W/ RETROGRADES Bilateral 04/05/2015   Procedure: CYSTOSCOPY WITH BILATERAL RETROGRADE PYELOGRAM, BLADDER BIOPSY;  Surgeon: Franchot Gallo, MD;  Location: [facility];  Service: Urology;  Laterality: Bilateral;  . CYSTOSCOPY WITH BIOPSY N/A 10/29/2015   Procedure: CYSTOSCOPY WITH BIOPSY;  Surgeon: Franchot Gallo, MD;  Location: [facility];  Service: Urology;  Laterality: N/A;  . INGUINAL HERNIA REPAIR Bilateral INFANT  . PILONIDAL CYST EXCISION  1978  . RADIOACTIVE PROSTATE SEED IMPLANTS  05-02-2011  . TRANSTHORACIC ECHOCARDIOGRAM  04-20-2013   dr Mauricio Po ([facility])   ef 55-60%/  mild LAE/ trivial MR , PRand TR/  mild AV sclerosis without stenosis  . TRANSURETHRAL RESECTION OF BLADDER TUMOR  05/02/2011   Procedure: CYSTOSCOPY/ RETROGRADE PYELOGRAM / LEFT URTEROSCOPY/ TRANSURETHRAL RESECTION OF BLADDER TUMOR (TURBT);  Surgeon: Hanley Ben, MD;  Location: [facility];  Service: Urology;  Laterality: N/A; MYTOMYCIN C  . TRANSURETHRAL RESECTION OF BLADDER TUMOR  08/08/2011   Procedure: TRANSURETHRAL RESECTION OF BLADDER TUMOR (TURBT);  Surgeon: Hanley Ben, MD;  Location: [facility];  Service: Urology;  Laterality: N/A;  TUR BLADDER BX  . TRANSURETHRAL RESECTION OF BLADDER TUMOR  11/28/2011   Procedure: TRANSURETHRAL RESECTION OF BLADDER TUMOR (TURBT);  Surgeon: Hanley Ben, MD;  Location: [facility];  Service:  Urology;  Laterality: N/A;    Allergies: No Known Allergies  Medications: No prescriptions prior to admission.     Social History: Social History   Social History  . Marital status: Married    Spouse name: N/A  . Number of children: N/A  . Years of education: N/A   Occupational History  . Not on file.   Social History Main Topics  . Smoking status: Former Smoker    Packs/day: 1.00    Years: 18.00    Types: Cigarettes    Quit date: 04/30/2009  . Smokeless tobacco: Never Used  . Alcohol use 0.0 oz/week      Comment: OCCASIONAL  . Drug use: No  . Sexual activity: Not on file   Other Topics Concern  . Not on file   Social History Narrative  . No narrative on file    Family History: History reviewed. No pertinent family history.  Review of Systems: Positive: Hematuria, dysuria. Negative:  A further 10 point review of systems was negative except what is listed in the HPI.                  Physical Exam: @VITALS2 @ General: No acute distress.  Awake. Head:  Normocephalic.  Atraumatic. ENT:  EOMI.  Mucous membranes moist Neck:  Supple.  No lymphadenopathy. CV:  S1 present. S2 present. Regular rate. Pulmonary: Equal effort bilaterally.  Clear to auscultation bilaterally. Abdomen: Soft.  Non tender to palpation. Skin:  Normal turgor.  No visible rash. Extremity: No gross deformity of bilateral upper extremities.  No gross deformity of                             lower extremities. Neurologic: Alert. Appropriate mood.    Studies:  No results for input(s): HGB, WBC, PLT in the last 72 hours.  No results for input(s): NA, K, CL, CO2, BUN, CREATININE, CALCIUM, GFRNONAA, GFRAA in the last 72 hours.  Invalid input(s): MAGNESIUM   No results for input(s): INR, APTT in the last 72 hours.  Invalid input(s): PT   Invalid input(s): ABG    Assessment:  H/O urothelial carcinoma of the bladder with persistent gross hematuria with inflammatory change of the bladder neck  Plan: Cysto, TUR-BN/prostate

## 2016-09-22 ENCOUNTER — Observation Stay (HOSPITAL_BASED_OUTPATIENT_CLINIC_OR_DEPARTMENT_OTHER)
Admission: RE | Admit: 2016-09-22 | Discharge: 2016-09-23 | Disposition: A | Discharge status: Home / Self Care | Payer: 59 | Source: Ambulatory Visit | Attending: Urology | Admitting: Urology

## 2016-09-22 ENCOUNTER — Other Ambulatory Visit: Payer: Self-pay

## 2016-09-22 ENCOUNTER — Encounter (HOSPITAL_BASED_OUTPATIENT_CLINIC_OR_DEPARTMENT_OTHER): Payer: Self-pay | Admitting: *Deleted

## 2016-09-22 ENCOUNTER — Ambulatory Visit (HOSPITAL_BASED_OUTPATIENT_CLINIC_OR_DEPARTMENT_OTHER): Payer: 59 | Admitting: Anesthesiology

## 2016-09-22 ENCOUNTER — Encounter (HOSPITAL_COMMUNITY)
Admission: RE | Disposition: A | Discharge status: Home / Self Care | Payer: Self-pay | Source: Ambulatory Visit | Attending: Urology

## 2016-09-22 DIAGNOSIS — Z8546 Personal history of malignant neoplasm of prostate: Secondary | ICD-10-CM | POA: Insufficient documentation

## 2016-09-22 DIAGNOSIS — Z8551 Personal history of malignant neoplasm of bladder: Secondary | ICD-10-CM | POA: Diagnosis not present

## 2016-09-22 DIAGNOSIS — E669 Obesity, unspecified: Secondary | ICD-10-CM | POA: Diagnosis not present

## 2016-09-22 DIAGNOSIS — Z87891 Personal history of nicotine dependence: Secondary | ICD-10-CM | POA: Diagnosis not present

## 2016-09-22 DIAGNOSIS — Z951 Presence of aortocoronary bypass graft: Secondary | ICD-10-CM | POA: Insufficient documentation

## 2016-09-22 DIAGNOSIS — G8929 Other chronic pain: Secondary | ICD-10-CM | POA: Insufficient documentation

## 2016-09-22 DIAGNOSIS — I2510 Atherosclerotic heart disease of native coronary artery without angina pectoris: Secondary | ICD-10-CM | POA: Diagnosis not present

## 2016-09-22 DIAGNOSIS — Z6829 Body mass index (BMI) 29.0-29.9, adult: Secondary | ICD-10-CM | POA: Diagnosis not present

## 2016-09-22 DIAGNOSIS — M545 Low back pain: Secondary | ICD-10-CM | POA: Diagnosis not present

## 2016-09-22 DIAGNOSIS — R310 Gross hematuria: Secondary | ICD-10-CM | POA: Diagnosis present

## 2016-09-22 DIAGNOSIS — E785 Hyperlipidemia, unspecified: Secondary | ICD-10-CM | POA: Insufficient documentation

## 2016-09-22 DIAGNOSIS — D303 Benign neoplasm of bladder: Principal | ICD-10-CM | POA: Insufficient documentation

## 2016-09-22 DIAGNOSIS — C679 Malignant neoplasm of bladder, unspecified: Secondary | ICD-10-CM | POA: Diagnosis present

## 2016-09-22 DIAGNOSIS — I252 Old myocardial infarction: Secondary | ICD-10-CM | POA: Insufficient documentation

## 2016-09-22 DIAGNOSIS — K219 Gastro-esophageal reflux disease without esophagitis: Secondary | ICD-10-CM | POA: Diagnosis not present

## 2016-09-22 HISTORY — DX: Malignant neoplasm of bladder, unspecified: C67.9

## 2016-09-22 HISTORY — PX: TRANSURETHRAL RESECTION OF PROSTATE: SHX73

## 2016-09-22 HISTORY — DX: Low back pain, unspecified: M54.50

## 2016-09-22 HISTORY — DX: Old myocardial infarction: I25.2

## 2016-09-22 HISTORY — DX: Other chronic pain: G89.29

## 2016-09-22 HISTORY — DX: Low back pain: M54.5

## 2016-09-22 LAB — POCT I-STAT, CHEM 8
BUN: 20 mg/dL (ref 6–20)
CALCIUM ION: 1.26 mmol/L (ref 1.15–1.40)
CHLORIDE: 107 mmol/L (ref 101–111)
Creatinine, Ser: 0.9 mg/dL (ref 0.61–1.24)
Glucose, Bld: 115 mg/dL — ABNORMAL HIGH (ref 65–99)
HCT: 47 % (ref 39.0–52.0)
Hemoglobin: 16 g/dL (ref 13.0–17.0)
POTASSIUM: 4.1 mmol/L (ref 3.5–5.1)
SODIUM: 143 mmol/L (ref 135–145)
TCO2: 25 mmol/L (ref 0–100)

## 2016-09-22 SURGERY — TURP (TRANSURETHRAL RESECTION OF PROSTATE)
Anesthesia: General | Site: Bladder

## 2016-09-22 MED ORDER — SODIUM CHLORIDE 0.45 % IV SOLN
INTRAVENOUS | Status: DC
  Administered 2016-09-22 (×2): via INTRAVENOUS

## 2016-09-22 MED ORDER — MIDAZOLAM HCL 2 MG/2ML IJ SOLN
INTRAMUSCULAR | Status: AC
  Filled 2016-09-22: qty 2

## 2016-09-22 MED ORDER — MIDAZOLAM HCL 5 MG/5ML IJ SOLN
INTRAMUSCULAR | Status: DC | PRN
  Administered 2016-09-22: 2 mg via INTRAVENOUS

## 2016-09-22 MED ORDER — MAGNESIUM CITRATE PO SOLN: 1.0000 | Freq: Once | ORAL | Status: DC

## 2016-09-22 MED ORDER — CEPHALEXIN 500 MG PO CAPS
500.0000 mg | ORAL_CAPSULE | Freq: Two times a day (BID) | ORAL | Status: DC
  Administered 2016-09-22 – 2016-09-23 (×2): 500 mg via ORAL
  Filled 2016-09-22 (×2): qty 1

## 2016-09-22 MED ORDER — LIDOCAINE 2% (20 MG/ML) 5 ML SYRINGE
INTRAMUSCULAR | Status: AC
  Filled 2016-09-22: qty 5

## 2016-09-22 MED ORDER — DEXAMETHASONE SODIUM PHOSPHATE 10 MG/ML IJ SOLN
INTRAMUSCULAR | Status: AC
  Filled 2016-09-22: qty 1

## 2016-09-22 MED ORDER — ONDANSETRON HCL 4 MG/2ML IJ SOLN: 4.0000 mg | INTRAMUSCULAR | Status: DC | PRN

## 2016-09-22 MED ORDER — PROMETHAZINE HCL 25 MG/ML IJ SOLN
6.2500 mg | INTRAMUSCULAR | Status: DC | PRN
  Filled 2016-09-22: qty 1

## 2016-09-22 MED ORDER — SODIUM CHLORIDE 0.9% FLUSH: 3.0000 mL | Freq: Two times a day (BID) | INTRAVENOUS | Status: DC

## 2016-09-22 MED ORDER — PROPOFOL 10 MG/ML IV BOLUS
INTRAVENOUS | Status: DC | PRN
Start: 2016-09-22 — End: 2016-09-22
  Administered 2016-09-22: 200 mg via INTRAVENOUS

## 2016-09-22 MED ORDER — ACETAMINOPHEN 325 MG PO TABS: 650.0000 mg | ORAL_TABLET | Freq: Four times a day (QID) | ORAL | Status: DC | PRN

## 2016-09-22 MED ORDER — ACETAMINOPHEN 650 MG RE SUPP: 650.0000 mg | RECTAL | Status: DC | PRN

## 2016-09-22 MED ORDER — CEFAZOLIN IN D5W 1 GM/50ML IV SOLN
1.0000 g | INTRAVENOUS | Status: DC
Start: 2016-09-22 — End: 2016-09-22
  Filled 2016-09-22: qty 50

## 2016-09-22 MED ORDER — CEPHALEXIN 500 MG PO CAPS: 500.0000 mg | ORAL_CAPSULE | Freq: Two times a day (BID) | ORAL | 0 refills | Status: AC

## 2016-09-22 MED ORDER — ONDANSETRON HCL 4 MG/2ML IJ SOLN
INTRAMUSCULAR | Status: AC
  Filled 2016-09-22: qty 2

## 2016-09-22 MED ORDER — OXYCODONE HCL 5 MG PO TABS
5.0000 mg | ORAL_TABLET | ORAL | Status: DC | PRN
  Administered 2016-09-23: 10 mg via ORAL
  Filled 2016-09-22: qty 2

## 2016-09-22 MED ORDER — LACTATED RINGERS IV SOLN
INTRAVENOUS | Status: DC
  Administered 2016-09-22: 07:00:00 via INTRAVENOUS
  Filled 2016-09-22: qty 1000

## 2016-09-22 MED ORDER — CEFAZOLIN SODIUM-DEXTROSE 2-4 GM/100ML-% IV SOLN
2.0000 g | INTRAVENOUS | Status: AC
Start: 2016-09-22 — End: 2016-09-22
  Administered 2016-09-22: 2 g via INTRAVENOUS
  Filled 2016-09-22: qty 100

## 2016-09-22 MED ORDER — ACETAMINOPHEN 325 MG PO TABS: 650.0000 mg | ORAL_TABLET | ORAL | Status: DC | PRN

## 2016-09-22 MED ORDER — CEFAZOLIN SODIUM-DEXTROSE 2-4 GM/100ML-% IV SOLN
INTRAVENOUS | Status: AC
  Filled 2016-09-22: qty 100

## 2016-09-22 MED ORDER — MEPERIDINE HCL 25 MG/ML IJ SOLN
6.2500 mg | INTRAMUSCULAR | Status: DC | PRN
  Filled 2016-09-22: qty 1

## 2016-09-22 MED ORDER — ROSUVASTATIN CALCIUM 20 MG PO TABS
20.0000 mg | ORAL_TABLET | Freq: Every evening | ORAL | Status: DC
  Administered 2016-09-22: 20 mg via ORAL
  Filled 2016-09-22: qty 1

## 2016-09-22 MED ORDER — METOPROLOL SUCCINATE ER 50 MG PO TB24
50.0000 mg | ORAL_TABLET | Freq: Every morning | ORAL | Status: DC
  Administered 2016-09-23: 50 mg via ORAL
  Filled 2016-09-22: qty 1

## 2016-09-22 MED ORDER — CYCLOBENZAPRINE HCL 10 MG PO TABS: 10.0000 mg | ORAL_TABLET | Freq: Three times a day (TID) | ORAL | Status: DC | PRN

## 2016-09-22 MED ORDER — DEXAMETHASONE SODIUM PHOSPHATE 4 MG/ML IJ SOLN
INTRAMUSCULAR | Status: DC | PRN
  Administered 2016-09-22: 10 mg via INTRAVENOUS

## 2016-09-22 MED ORDER — HYDROMORPHONE HCL 1 MG/ML IJ SOLN
0.2500 mg | INTRAMUSCULAR | Status: DC | PRN
  Filled 2016-09-22: qty 0.5

## 2016-09-22 MED ORDER — SODIUM CHLORIDE 0.9% FLUSH: 3.0000 mL | INTRAVENOUS | Status: DC | PRN

## 2016-09-22 MED ORDER — LIDOCAINE HCL (CARDIAC) 20 MG/ML IV SOLN
INTRAVENOUS | Status: DC | PRN
  Administered 2016-09-22: 100 mg via INTRAVENOUS

## 2016-09-22 MED ORDER — MAGNESIUM CITRATE PO SOLN
1.0000 | Freq: Every day | ORAL | Status: DC | PRN
  Administered 2016-09-22: 1 via ORAL
  Filled 2016-09-22: qty 296

## 2016-09-22 MED ORDER — HYDROCODONE-ACETAMINOPHEN 7.5-325 MG PO TABS: 1.0000 | ORAL_TABLET | Freq: Four times a day (QID) | ORAL | Status: DC | PRN

## 2016-09-22 MED ORDER — BELLADONNA ALKALOIDS-OPIUM 16.2-60 MG RE SUPP
1.0000 | Freq: Four times a day (QID) | RECTAL | Status: DC | PRN
  Administered 2016-09-22 (×2): 1 via RECTAL
  Filled 2016-09-22 (×2): qty 1

## 2016-09-22 MED ORDER — ONDANSETRON HCL 4 MG/2ML IJ SOLN
INTRAMUSCULAR | Status: DC | PRN
  Administered 2016-09-22: 4 mg via INTRAVENOUS

## 2016-09-22 MED ORDER — BELLADONNA ALKALOIDS-OPIUM 16.2-60 MG RE SUPP: 1.0000 | Freq: Four times a day (QID) | RECTAL | Status: DC | PRN

## 2016-09-22 MED ORDER — KETOROLAC TROMETHAMINE 30 MG/ML IJ SOLN
30.0000 mg | Freq: Once | INTRAMUSCULAR | Status: DC | PRN
  Filled 2016-09-22: qty 1

## 2016-09-22 MED ORDER — FENTANYL CITRATE (PF) 100 MCG/2ML IJ SOLN
INTRAMUSCULAR | Status: DC | PRN
  Administered 2016-09-22 (×2): 50 ug via INTRAVENOUS

## 2016-09-22 MED ORDER — PROPOFOL 10 MG/ML IV BOLUS
INTRAVENOUS | Status: AC
  Filled 2016-09-22: qty 40

## 2016-09-22 MED ORDER — FENTANYL CITRATE (PF) 100 MCG/2ML IJ SOLN
INTRAMUSCULAR | Status: AC
  Filled 2016-09-22: qty 2

## 2016-09-22 MED ORDER — ARTIFICIAL TEARS OP OINT
TOPICAL_OINTMENT | OPHTHALMIC | Status: AC
  Filled 2016-09-22: qty 3.5

## 2016-09-22 MED ORDER — SODIUM CHLORIDE 0.9 % IV SOLN: 250.0000 mL | INTRAVENOUS | Status: DC | PRN

## 2016-09-22 MED ORDER — SENNA 8.6 MG PO TABS
1.0000 | ORAL_TABLET | Freq: Two times a day (BID) | ORAL | Status: DC
  Administered 2016-09-22 – 2016-09-23 (×2): 8.6 mg via ORAL
  Filled 2016-09-22 (×2): qty 1

## 2016-09-22 SURGICAL SUPPLY — 31 items
BAG DRAIN URO-CYSTO SKYTR STRL (DRAIN) ×2 IMPLANT
BAG URINE DRAINAGE (UROLOGICAL SUPPLIES) IMPLANT
BAG URINE LEG 19OZ MD ST LTX (BAG) ×2 IMPLANT
CATH FOLEY 2WAY SLVR  5CC 20FR (CATHETERS) ×1
CATH FOLEY 2WAY SLVR  5CC 22FR (CATHETERS)
CATH FOLEY 2WAY SLVR 30CC 22FR (CATHETERS) IMPLANT
CATH FOLEY 2WAY SLVR 5CC 20FR (CATHETERS) ×1 IMPLANT
CATH FOLEY 2WAY SLVR 5CC 22FR (CATHETERS) IMPLANT
CATH FOLEY 3WAY 30CC 22F (CATHETERS) IMPLANT
CATH HEMA 3WAY 30CC 24FR COUDE (CATHETERS) IMPLANT
CATH HEMA 3WAY 30CC 24FR RND (CATHETERS) IMPLANT
CLOTH BEACON ORANGE TIMEOUT ST (SAFETY) ×2 IMPLANT
ELECT BUTTON BIOP 24F 90D PLAS (MISCELLANEOUS) IMPLANT
ELECT REM PT RETURN 9FT ADLT (ELECTROSURGICAL) ×2
ELECTRODE REM PT RTRN 9FT ADLT (ELECTROSURGICAL) ×1 IMPLANT
EVACUATOR MICROVAS BLADDER (UROLOGICAL SUPPLIES) IMPLANT
GLOVE BIO SURGEON STRL SZ8 (GLOVE) ×2 IMPLANT
GOWN STRL REUS W/ TWL LRG LVL3 (GOWN DISPOSABLE) ×1 IMPLANT
GOWN STRL REUS W/ TWL XL LVL3 (GOWN DISPOSABLE) ×1 IMPLANT
GOWN STRL REUS W/TWL LRG LVL3 (GOWN DISPOSABLE) ×1
GOWN STRL REUS W/TWL XL LVL3 (GOWN DISPOSABLE) ×1
HOLDER FOLEY CATH W/STRAP (MISCELLANEOUS) IMPLANT
KIT RM TURNOVER CYSTO AR (KITS) ×2 IMPLANT
LOOP CUT BIPOLAR 24F LRG (ELECTROSURGICAL) ×2 IMPLANT
MANIFOLD NEPTUNE II (INSTRUMENTS) ×2 IMPLANT
PACK CYSTO (CUSTOM PROCEDURE TRAY) ×2 IMPLANT
PLUG CATH AND CAP STER (CATHETERS) IMPLANT
SET ASPIRATION TUBING (TUBING) IMPLANT
SYR 30ML LL (SYRINGE) ×2 IMPLANT
SYRINGE IRR TOOMEY STRL 70CC (SYRINGE) ×2 IMPLANT
TUBE CONNECTING 12X1/4 (SUCTIONS) ×2 IMPLANT

## 2016-09-22 NOTE — Anesthesia Preprocedure Evaluation (Signed)
Anesthesia Evaluation  Patient identified by MRN, date of birth, ID band Patient awake    Reviewed: Allergy & Precautions, H&P , NPO status , Patient's Chart, lab work & pertinent test results, reviewed documented beta blocker date and time   Airway Mallampati: II  TM Distance: >3 FB Neck ROM: Full    Dental  (+) Dental Advisory Given,    Pulmonary former smoker,    breath sounds clear to auscultation       Cardiovascular hypertension, Pt. on home beta blockers and Pt. on medications + CAD, + Past MI and + CABG (2014)   Rhythm:Regular Rate:Normal     Neuro/Psych negative neurological ROS  negative psych ROS   GI/Hepatic Neg liver ROS, GERD  ,  Endo/Other  Obesity   Renal/GU Renal disease   Prostate cancer    Musculoskeletal negative musculoskeletal ROS (+)   Abdominal Normal abdominal exam  (+)   Peds  Hematology negative hematology ROS (+)   Anesthesia Other Findings Upper front bridge  Reproductive/Obstetrics                             Anesthesia Physical  Anesthesia Plan  ASA: III  Anesthesia Plan: General   Post-op Pain Management:    Induction: Intravenous  Airway Management Planned: LMA  Additional Equipment:   Intra-op Plan:   Post-operative Plan: Extubation in OR  Informed Consent: I have reviewed the patients History and Physical, chart, labs and discussed the procedure including the risks, benefits and alternatives for the proposed anesthesia with the patient or authorized representative who has indicated his/her understanding and acceptance.   Dental advisory given  Plan Discussed with: CRNA and Surgeon  Anesthesia Plan Comments:         Anesthesia Quick Evaluation

## 2016-09-22 NOTE — Anesthesia Procedure Notes (Signed)
Procedure Name: LMA Insertion Date/Time: 09/22/2016 7:36 AM Performed by: Lyn Hollingshead Pre-anesthesia Checklist: Patient identified, Emergency Drugs available, Suction available and Patient being monitored Patient Re-evaluated:Patient Re-evaluated prior to inductionOxygen Delivery Method: Circle system utilized Preoxygenation: Pre-oxygenation with 100% oxygen Intubation Type: IV induction Ventilation: Mask ventilation without difficulty LMA: LMA inserted LMA Size: 4.0 Number of attempts: 1 Airway Equipment and Method: Bite block Placement Confirmation: positive ETCO2 Tube secured with: Tape Dental Injury: Teeth and Oropharynx as per pre-operative assessment

## 2016-09-22 NOTE — Discharge Instructions (Signed)

## 2016-09-22 NOTE — Interval H&P Note (Signed)
History and Physical Interval Note:  09/22/2016 7:27 AM  Richard Brewer  has presented today for surgery, with the diagnosis of BLADDER CANCER, HEMATURIA  The various methods of treatment have been discussed with the patient and family. After consideration of risks, benefits and other options for treatment, the patient has consented to  Procedure(s): TRANSURETHRAL RESECTION OF THE PROSTATE (TURP) (N/A) as a surgical intervention .  The patient's history has been reviewed, patient examined, no change in status, stable for surgery.  I have reviewed the patient's chart and labs.  Questions were answered to the patient's satisfaction.     Jorja Loa

## 2016-09-22 NOTE — Transfer of Care (Signed)
Last Vitals:  Vitals:   09/22/16 0610 09/22/16 0830  BP: 133/87   Pulse: 65   Resp: 18   Temp: 36.9 C 36.4 C    Last Pain:  Vitals:   09/22/16 0627  TempSrc:   PainSc: 3       Patients Stated Pain Goal: 5 (09/22/16 5051)  Immediate Anesthesia Transfer of Care Note  Patient: Richard Brewer  Procedure(s) Performed: Procedure(s) (LRB): TRANSURETHRAL RESECTION OF THE PROSTATE (TURP) (N/A)  Patient Location: PACU  Anesthesia Type: General  Level of Consciousness: awake, alert  and oriented  Airway & Oxygen Therapy: Patient Spontanous Breathing and Patient connected to nasal cannula oxygen  Post-op Assessment: Report given to PACU RN and Post -op Vital signs reviewed and stable  Post vital signs: Reviewed and stable  Complications: No apparent anesthesia complications

## 2016-09-22 NOTE — Op Note (Signed)
Preoperative diagnosis: History of bladder cancer with bladder neck lesion/intermittent hematuria  Postoperative diagnosis: Same  Principal procedure: Cystoscopy, bladder biopsy, TUR of bladder neck, TUR prostate  Surgeon: Liah Morr  Anesthesia: Gen. with LMA  Complications: None  Specimen: 1.  Bladder neck lesions.  2.  Prostatic urethral biopsy 3.  Bladder biopsies, random  Estimated blood loss: Less than 25 mL  Drains: 20 French Foley catheter, to leg bag.  Indications: 55 year old male with history of bladder cancer, status post BCG treatments.  The patient had prior bladder neck biopsy which revealed inflammatory tissue.  The patient has persistent gross hematuria with dysuria, with his hematuria being initial in nature.  Recent cystoscopy in the office revealed inflammatory tissue at the bladder neck without significant bladder lesions.  He presents at this time for TUR of the bladder neck and prostatic urethra.  Findings: The bladder appeared grossly normal with mild erythema around several old biopsy sites.  The erythema was more along the lines of mild telangiectatic vessels.  Ureteral orifices were normal in configuration and location.  Within the bladder itself, there were no papillary or nodular lesions.  At the bladder neck, anteriorly at approximately the 1 o'clock position, there was some mild papillary lesions extending from about 1-3 o'clock.  Prostatic urethra was nonobstructive, but had mild erythema.  Description of procedure: The patient was properly identified in the holding area.  He received preoperative IV antibiotics.  He was taken to the operating room where general anesthesia was administered with the LMA.  He was placed in the dorsolithotomy position.  Genitalia and perineum were prepped and draped.  Timeout was performed.  Meatus was dilated to 30 [place] with [employer] sounds.  I then passed the 26 French resectoscope using the visual obturator.  No urethral  lesions were noted.  The prostate was nonobstructive.  The bladder was then entered and inspected circumferentially.  The above findings were noted.  I then placed the resectoscope with the cutting loop.  The bladder neck, from approximately the 11 o'clock position to the 4 o'clock position was then resected.  The above lesions were incorporated in this resection.  Following resection, down into the muscular layer, the biopsy site was then cauterized using the coag current, until hemostasis was achieved.  The biopsy fragments were then sent labeled "bladder neck lesions".  I then used the same resectoscope loop to resect the prostatic urethra.  There was no significant obstruction to the prostate.  These fragments were then sent labeled "prostatic urethral biopsies".  The prostate was then cauterized with electrocautery until hemostasis was achieved.  I then removed the resectoscope, and placed the cold cup biopsy forceps.  Several random biopsies were taken of these mildly erythematous lesions around the old biopsy scars.  These were sent labeled "random bladder biopsies".  Biopsy sites were then cauterized using the loop with the coag current.  Hemostasis was excellent, both in the old biopsy sites within the bladder and the bladder neck/prostatic urethral areas.  At this point, the resectoscope was removed.  I then placed a 20 French Foley catheter, the balloon filled with 10 mL of water.  This was then hooked to a leg bag.  The patient was then awakened and taken to the PACU in stable condition.  He tolerated the procedure well.

## 2016-09-22 NOTE — Anesthesia Postprocedure Evaluation (Addendum)
Anesthesia Post Note  Patient: Richard Brewer  Procedure(s) Performed: Procedure(s) (LRB): TRANSURETHRAL RESECTION OF THE PROSTATE (TURP) (N/A)  Patient location during evaluation: PACU Anesthesia Type: General Level of consciousness: awake Pain management: pain level controlled Vital Signs Assessment: post-procedure vital signs reviewed and stable Respiratory status: spontaneous breathing Cardiovascular status: stable Postop Assessment: no signs of nausea or vomiting Anesthetic complications: no        Last Vitals:  Vitals:   09/22/16 0845 09/22/16 0900  BP: 137/74 (!) 143/75  Pulse: (!) 50 (!) 45  Resp: 15 (!) 0  Temp:      Last Pain:  Vitals:   09/22/16 0900  TempSrc:   PainSc: 2    Pain Goal: Patients Stated Pain Goal: 5 (09/22/16 0627)               [name],JOHN Mateo Flow

## 2016-09-23 ENCOUNTER — Encounter (HOSPITAL_BASED_OUTPATIENT_CLINIC_OR_DEPARTMENT_OTHER): Payer: Self-pay | Admitting: Urology

## 2016-09-23 DIAGNOSIS — D303 Benign neoplasm of bladder: Secondary | ICD-10-CM | POA: Diagnosis not present

## 2016-09-23 LAB — HIV ANTIBODY (ROUTINE TESTING W REFLEX): HIV SCREEN 4TH GENERATION: NONREACTIVE

## 2016-09-23 MED ORDER — PHENAZOPYRIDINE HCL 200 MG PO TABS
200.0000 mg | ORAL_TABLET | Freq: Three times a day (TID) | ORAL | Status: DC
  Administered 2016-09-23: 200 mg via ORAL
  Filled 2016-09-23 (×3): qty 1

## 2016-09-23 MED ORDER — OXYBUTYNIN CHLORIDE 5 MG PO TABS
5.0000 mg | ORAL_TABLET | Freq: Three times a day (TID) | ORAL | Status: DC
  Administered 2016-09-23: 5 mg via ORAL
  Filled 2016-09-23: qty 1

## 2016-09-23 NOTE — Progress Notes (Signed)
Patient is a&ox4, ambulatory without assist. Discharge instructions reviewed. No change from am assessment. Questions, concerns denied.

## 2016-09-26 ENCOUNTER — Encounter (HOSPITAL_BASED_OUTPATIENT_CLINIC_OR_DEPARTMENT_OTHER): Payer: Self-pay | Admitting: Urology

## 2016-10-08 ENCOUNTER — Other Ambulatory Visit (HOSPITAL_COMMUNITY)
Admission: RE | Admit: 2016-10-08 | Discharge: 2016-10-08 | Disposition: A | Discharge status: Home / Self Care | Payer: 59 | Source: Ambulatory Visit | Attending: Specialist | Admitting: Specialist

## 2016-10-08 DIAGNOSIS — C679 Malignant neoplasm of bladder, unspecified: Secondary | ICD-10-CM | POA: Diagnosis not present

## 2016-10-08 DIAGNOSIS — R319 Hematuria, unspecified: Secondary | ICD-10-CM | POA: Diagnosis not present

## 2016-10-16 NOTE — Discharge Summary (Signed)
Patient ID: Richard Brewer MRN: [id number] DOB/AGE: May 01, 1961 56 y.o.  Admit date: 09/22/2016 Discharge date: 09/23/2016  Primary Care Physician:  Curly Rim, MD  Discharge Diagnoses:   Present on Admission: . Hematuria, gross Benign lesion of bladder  Consults:  None     Discharge Medications: Allergies as of 09/23/2016   No Known Allergies     Medication List    TAKE these medications   acetaminophen 500 MG tablet Commonly known as:  TYLENOL Take 500 mg by mouth every 6 (six) hours as needed for mild pain, moderate pain, fever or headache.   cephALEXin 500 MG capsule Commonly known as:  KEFLEX Take 1 capsule (500 mg total) by mouth 2 (two) times daily.   cyclobenzaprine 10 MG tablet Commonly known as:  FLEXERIL Take 10 mg by mouth 3 (three) times daily as needed for muscle spasms.   HYDROcodone-acetaminophen 5-325 MG tablet Commonly known as:  NORCO/VICODIN Take 1 tablet by mouth every 8 (eight) hours as needed for moderate pain.   metoprolol succinate 50 MG 24 hr tablet Commonly known as:  TOPROL-XL Take 50 mg by mouth daily.   oxybutynin 5 MG tablet Commonly known as:  DITROPAN Take 5 mg by mouth every 8 (eight) hours as needed for bladder spasms.   phenazopyridine 100 MG tablet Commonly known as:  PYRIDIUM Take 100 mg by mouth 3 (three) times daily as needed for pain.   PRALUENT 75 MG/ML Sopn Generic drug:  Alirocumab Inject 75 mg into the skin every 14 (fourteen) days.   rosuvastatin 40 MG tablet Commonly known as:  CRESTOR Take 20 mg by mouth at bedtime.        Significant Diagnostic Studies:  No results found.  Brief H and P: For complete details please refer to admission H and P, but in brief , The patient has a history of bladder cancer, and has had a recurrent bladder neck lesion with recurrent hematuria.  He is admitted for cystoscopy, resection of his bladder neck as well as postoperative recovery with a catheter.  Hospital Course:   Active Problems:   Hematuria, gross The patient was admitted to the floor following his procedure with a Foley catheter.  He was kept overnight for observation and bladder irrigation.  His catheter was removed on the first postoperative day and he voided well and was discharged at that time.  Day of Discharge BP 122/64   Pulse (!) 57   Temp 97.9 F (36.6 C) (Oral)   Resp 16   Ht 5\' 9"  (1.753 m)   Wt 92.1 kg (203 lb)   SpO2 95%   BMI 29.98 kg/m   No results found for this or any previous visit (from the past 24 hour(s)).  Physical Exam: General: Alert and awake oriented x3 not in any acute distress. HEENT: anicteric sclera, pupils reactive to light and accommodation CVS: S1-S2 clear no murmur rubs or gallops Chest: clear to auscultation bilaterally, no wheezing rales or rhonchi Abdomen: soft nontender, nondistended, normal bowel sounds, no organomegaly Extremities: no cyanosis, clubbing or edema noted bilaterally Neuro: Cranial nerves II-XII intact, no focal neurological deficits  Disposition:  Home  Diet:  No restrictions  Activity:  Gradually increase   Disposition and Follow-up:    He will follow-up in our office, that appointment scheduled  TESTS THAT NEED FOLLOW-UP  Review of pathology  DISCHARGE FOLLOW-UP Follow-up Information    Jorja Loa, MD.   Specialty:  Urology Why:  May 11 at 8 a.m.  Contact information: [place] [address] [phone]           Time spent on Discharge:  10 minutes  Signed: Jorja Loa 10/16/2016, 5:51 AM

## 2016-10-22 ENCOUNTER — Encounter (HOSPITAL_COMMUNITY): Payer: Self-pay

## 2016-11-21 NOTE — Addendum Note (Signed)
Addendum  created 11/21/16 1147 by Lyn Hollingshead, MD   Sign clinical note
